# Patient Record
Sex: Female | Born: 1990 | Race: White | Hispanic: No | Marital: Married | Smoking: Former smoker
Health system: Southern US, Community
[De-identification: ages and names within clinical notes are randomized; demographics above are authoritative.]

## PROBLEM LIST (undated history)

## (undated) ENCOUNTER — Inpatient Hospital Stay: Payer: Self-pay | Admitting: Obstetrics and Gynecology

## (undated) DIAGNOSIS — F419 Anxiety disorder, unspecified: Secondary | ICD-10-CM

## (undated) DIAGNOSIS — F32A Depression, unspecified: Secondary | ICD-10-CM

## (undated) DIAGNOSIS — Z789 Other specified health status: Secondary | ICD-10-CM

## (undated) HISTORY — PX: NO PAST SURGERIES: SHX2092

---

## 2015-12-12 NOTE — L&D Delivery Note (Addendum)
Obstetrical Delivery Note   Date of Delivery:   12/16/2015 Primary OB:   [facility] Gestational Age/EDD: 3729w2d (Dated by 9 wk ultrasound) Antepartum complications: anemia  Delivered By:   Farrel ConnersGUTIERREZ, Laquon Emel, CNM  Delivery Type:   spontaneous vaginal delivery after vacuum assisted descent from +3 x 3 contractions with one pop off. Vacuum applied due to prolonged repetitive fetal heart rate decelerations Procedure Details:   After SNA was performed by anesthesia, patient labored down to +2 and because of variable decelerations, patient was encouraged to start pushing. She brought the baby down to +3 station with good maternal effort, pushing every other contraction to allow baby to recover from deceleration with pushing. Because it was taking baby longer to recover from deceleration, Kiwi vacuum was applied and over three contractions, baby started to crown at which time the vacuum popped off. Patient was then to deliver the fetal head then body spontaneously with good maternal effort. Baby was vigorous with good cry. Delayed cord clamping was performed and FOB cut cord. Baby placed skin to skin with mother Anesthesia:    Spinal narcotics administration Intrapartum complications: prolonged fetal heart rate decelerations/ PPROM GBS:    unknown Laceration:    2nd degree, small perineal repaired with 2-0 and 3-0 Chromic Episiotomy:    none Placenta:    Via active 3rd stage. To pathology: no Estimated Blood Loss:  400  Baby:    Liveborn female, Apgars 8/9, weight: 6-3 (2810) / Victoria/ Breast    Reginal Wojcicki, CNM

## 2015-12-16 ENCOUNTER — Encounter: Payer: Self-pay | Admitting: *Deleted

## 2015-12-16 ENCOUNTER — Inpatient Hospital Stay: Payer: Managed Care, Other (non HMO) | Admitting: Certified Registered"

## 2015-12-16 ENCOUNTER — Inpatient Hospital Stay
Admission: EM | Admit: 2015-12-16 | Discharge: 2015-12-18 | DRG: 775 | Disposition: A | Discharge status: Home / Self Care | Payer: Managed Care, Other (non HMO) | Attending: Obstetrics & Gynecology | Admitting: Obstetrics & Gynecology

## 2015-12-16 DIAGNOSIS — D62 Acute posthemorrhagic anemia: Secondary | ICD-10-CM | POA: Diagnosis not present

## 2015-12-16 DIAGNOSIS — O42913 Preterm premature rupture of membranes, unspecified as to length of time between rupture and onset of labor, third trimester: Principal | ICD-10-CM | POA: Diagnosis present

## 2015-12-16 DIAGNOSIS — O9081 Anemia of the puerperium: Secondary | ICD-10-CM | POA: Diagnosis not present

## 2015-12-16 DIAGNOSIS — Z3A36 36 weeks gestation of pregnancy: Secondary | ICD-10-CM | POA: Diagnosis not present

## 2015-12-16 HISTORY — DX: Other specified health status: Z78.9

## 2015-12-16 LAB — CBC
HEMATOCRIT: 35.8 % (ref 35.0–47.0)
Hemoglobin: 11.9 g/dL — ABNORMAL LOW (ref 12.0–16.0)
MCH: 30 pg (ref 26.0–34.0)
MCHC: 33.4 g/dL (ref 32.0–36.0)
MCV: 89.9 fL (ref 80.0–100.0)
Platelets: 212 10*3/uL (ref 150–440)
RBC: 3.98 MIL/uL (ref 3.80–5.20)
RDW: 13.5 % (ref 11.5–14.5)
WBC: 15.8 10*3/uL — ABNORMAL HIGH (ref 3.6–11.0)

## 2015-12-16 LAB — ABO/RH: ABO/RH(D): O POS

## 2015-12-16 LAB — TYPE AND SCREEN
ABO/RH(D): O POS
Antibody Screen: NEGATIVE

## 2015-12-16 MED ORDER — DOCUSATE SODIUM 100 MG PO CAPS
100.0000 mg | ORAL_CAPSULE | Freq: Every day | ORAL | Status: DC
  Administered 2015-12-16 – 2015-12-17 (×2): 100 mg via ORAL
  Filled 2015-12-16 (×2): qty 1

## 2015-12-16 MED ORDER — MISOPROSTOL 200 MCG PO TABS
ORAL_TABLET | ORAL | Status: AC
  Filled 2015-12-16: qty 4

## 2015-12-16 MED ORDER — NALOXONE HCL 2 MG/2ML IJ SOSY: 1.0000 ug/kg/h | PREFILLED_SYRINGE | INTRAVENOUS | Status: DC | PRN

## 2015-12-16 MED ORDER — OXYTOCIN 40 UNITS IN LACTATED RINGERS INFUSION - SIMPLE MED
INTRAVENOUS | Status: AC
  Filled 2015-12-16: qty 1000

## 2015-12-16 MED ORDER — FERROUS SULFATE 325 (65 FE) MG PO TABS
325.0000 mg | ORAL_TABLET | Freq: Every day | ORAL | Status: DC
  Administered 2015-12-17: 325 mg via ORAL
  Filled 2015-12-16: qty 1

## 2015-12-16 MED ORDER — SODIUM CHLORIDE 0.9 % IJ SOLN: 3.0000 mL | INTRAMUSCULAR | Status: DC | PRN

## 2015-12-16 MED ORDER — SIMETHICONE 80 MG PO CHEW: 80.0000 mg | CHEWABLE_TABLET | ORAL | Status: DC | PRN

## 2015-12-16 MED ORDER — SENNOSIDES-DOCUSATE SODIUM 8.6-50 MG PO TABS
2.0000 | ORAL_TABLET | ORAL | Status: DC
  Administered 2015-12-16 – 2015-12-18 (×2): 2 via ORAL
  Filled 2015-12-16 (×2): qty 2

## 2015-12-16 MED ORDER — ONDANSETRON HCL 4 MG/2ML IJ SOLN: 4.0000 mg | Freq: Three times a day (TID) | INTRAMUSCULAR | Status: DC | PRN

## 2015-12-16 MED ORDER — AMMONIA AROMATIC IN INHA: 0.3000 mL | Freq: Once | RESPIRATORY_TRACT | Status: DC | PRN

## 2015-12-16 MED ORDER — LANOLIN HYDROUS EX OINT: TOPICAL_OINTMENT | CUTANEOUS | Status: DC | PRN

## 2015-12-16 MED ORDER — INFLUENZA VAC SPLIT QUAD 0.5 ML IM SUSY: 0.5000 mL | PREFILLED_SYRINGE | INTRAMUSCULAR | Status: DC

## 2015-12-16 MED ORDER — FENTANYL CITRATE (PF) 100 MCG/2ML IJ SOLN
INTRAMUSCULAR | Status: AC
  Administered 2015-12-16: 25 ug via INTRATHECAL
  Filled 2015-12-16: qty 2

## 2015-12-16 MED ORDER — IBUPROFEN 600 MG PO TABS
600.0000 mg | ORAL_TABLET | Freq: Four times a day (QID) | ORAL | Status: DC | PRN
  Filled 2015-12-16 (×3): qty 1

## 2015-12-16 MED ORDER — MISOPROSTOL 200 MCG PO TABS
800.0000 ug | ORAL_TABLET | Freq: Once | ORAL | Status: DC | PRN
  Filled 2015-12-16: qty 4

## 2015-12-16 MED ORDER — BUTORPHANOL TARTRATE 1 MG/ML IJ SOLN
1.0000 mg | INTRAMUSCULAR | Status: DC | PRN
  Administered 2015-12-16: 1 mg via INTRAVENOUS
  Filled 2015-12-16: qty 1

## 2015-12-16 MED ORDER — OXYTOCIN 10 UNIT/ML IJ SOLN
INTRAMUSCULAR | Status: AC
  Filled 2015-12-16: qty 2

## 2015-12-16 MED ORDER — SCOPOLAMINE 1 MG/3DAYS TD PT72
1.0000 | MEDICATED_PATCH | Freq: Once | TRANSDERMAL | Status: DC
  Filled 2015-12-16: qty 1

## 2015-12-16 MED ORDER — OXYTOCIN BOLUS FROM INFUSION
500.0000 mL | INTRAVENOUS | Status: DC
  Administered 2015-12-16: 500 mL via INTRAVENOUS

## 2015-12-16 MED ORDER — BUPIVACAINE IN DEXTROSE 0.75-8.25 % IT SOLN
INTRATHECAL | Status: DC | PRN
  Administered 2015-12-16: .4 mL via INTRATHECAL

## 2015-12-16 MED ORDER — DIPHENHYDRAMINE HCL 25 MG PO CAPS: 25.0000 mg | ORAL_CAPSULE | ORAL | Status: DC | PRN

## 2015-12-16 MED ORDER — NALBUPHINE HCL 10 MG/ML IJ SOLN: 5.0000 mg | Freq: Once | INTRAMUSCULAR | Status: DC | PRN

## 2015-12-16 MED ORDER — WITCH HAZEL-GLYCERIN EX PADS: 1.0000 "application " | MEDICATED_PAD | CUTANEOUS | Status: DC | PRN

## 2015-12-16 MED ORDER — LACTATED RINGERS IV SOLN: 500.0000 mL | INTRAVENOUS | Status: DC | PRN

## 2015-12-16 MED ORDER — LACTATED RINGERS IV SOLN
INTRAVENOUS | Status: DC
  Administered 2015-12-16: 125 mL/h via INTRAVENOUS

## 2015-12-16 MED ORDER — MEPERIDINE HCL 25 MG/ML IJ SOLN: 6.2500 mg | INTRAMUSCULAR | Status: DC | PRN

## 2015-12-16 MED ORDER — ONDANSETRON HCL 4 MG/2ML IJ SOLN
4.0000 mg | INTRAMUSCULAR | Status: DC | PRN
Start: 2015-12-16 — End: 2015-12-18

## 2015-12-16 MED ORDER — TERBUTALINE SULFATE 1 MG/ML IJ SOLN
INTRAMUSCULAR | Status: AC
  Administered 2015-12-16: 0.25 mg
  Filled 2015-12-16: qty 1

## 2015-12-16 MED ORDER — SODIUM CHLORIDE 0.9 % IV SOLN
1.0000 g | INTRAVENOUS | Status: DC
  Filled 2015-12-16 (×3): qty 1000

## 2015-12-16 MED ORDER — LIDOCAINE HCL (PF) 1 % IJ SOLN
INTRAMUSCULAR | Status: AC
  Filled 2015-12-16: qty 30

## 2015-12-16 MED ORDER — IBUPROFEN 600 MG PO TABS
600.0000 mg | ORAL_TABLET | Freq: Four times a day (QID) | ORAL | Status: DC
  Administered 2015-12-16 – 2015-12-18 (×6): 600 mg via ORAL
  Filled 2015-12-16 (×3): qty 1

## 2015-12-16 MED ORDER — AMMONIA AROMATIC IN INHA
RESPIRATORY_TRACT | Status: AC
  Filled 2015-12-16: qty 10

## 2015-12-16 MED ORDER — NALBUPHINE HCL 10 MG/ML IJ SOLN: 5.0000 mg | INTRAMUSCULAR | Status: DC | PRN

## 2015-12-16 MED ORDER — LIDOCAINE HCL (PF) 1 % IJ SOLN
30.0000 mL | INTRAMUSCULAR | Status: DC | PRN
  Filled 2015-12-16: qty 30

## 2015-12-16 MED ORDER — OXYTOCIN 40 UNITS IN LACTATED RINGERS INFUSION - SIMPLE MED
2.5000 [IU]/h | INTRAVENOUS | Status: DC
  Administered 2015-12-16: 2.5 [IU]/h via INTRAVENOUS

## 2015-12-16 MED ORDER — ONDANSETRON HCL 4 MG/2ML IJ SOLN: 4.0000 mg | Freq: Four times a day (QID) | INTRAMUSCULAR | Status: DC | PRN

## 2015-12-16 MED ORDER — DIPHENHYDRAMINE HCL 50 MG/ML IJ SOLN: 12.5000 mg | INTRAMUSCULAR | Status: DC | PRN

## 2015-12-16 MED ORDER — PRENATAL MULTIVITAMIN CH
1.0000 | ORAL_TABLET | Freq: Every day | ORAL | Status: DC
  Administered 2015-12-17: 1 via ORAL
  Filled 2015-12-16: qty 1

## 2015-12-16 MED ORDER — TETANUS-DIPHTH-ACELL PERTUSSIS 5-2.5-18.5 LF-MCG/0.5 IM SUSP: 0.5000 mL | Freq: Once | INTRAMUSCULAR | Status: DC

## 2015-12-16 MED ORDER — BENZOCAINE-MENTHOL 20-0.5 % EX AERO
1.0000 "application " | INHALATION_SPRAY | CUTANEOUS | Status: DC | PRN
  Filled 2015-12-16: qty 56

## 2015-12-16 MED ORDER — NALOXONE HCL 0.4 MG/ML IJ SOLN: 0.4000 mg | INTRAMUSCULAR | Status: DC | PRN

## 2015-12-16 MED ORDER — ONDANSETRON HCL 4 MG PO TABS: 4.0000 mg | ORAL_TABLET | ORAL | Status: DC | PRN

## 2015-12-16 MED ORDER — SODIUM CHLORIDE 0.9 % IV SOLN
2.0000 g | Freq: Once | INTRAVENOUS | Status: AC
  Administered 2015-12-16: 2 g via INTRAVENOUS

## 2015-12-16 MED ORDER — DIBUCAINE 1 % RE OINT: 1.0000 "application " | TOPICAL_OINTMENT | RECTAL | Status: DC | PRN

## 2015-12-16 NOTE — Progress Notes (Signed)
FSE removed for vacum placement. Vacum applied 1052 am. Suction released between contractions. Pop off x1.

## 2015-12-16 NOTE — Anesthesia Procedure Notes (Signed)
Spinal Patient location during procedure: OB Start time: 12/16/2015 9:35 AM End time: 12/16/2015 9:42 AM Reason for block: procedure for pain Staffing Anesthesiologist: Marline Backbone F Resident/CRNA: Rolla Plate Performed by: resident/CRNA  Preanesthetic Checklist Completed: patient identified, site marked, surgical consent, pre-op evaluation, timeout performed, IV checked, risks and benefits discussed and monitors and equipment checked Spinal Block Patient position: sitting Prep: ChloraPrep and site prepped and draped Patient monitoring: heart rate, continuous pulse ox, blood pressure and cardiac monitor Approach: midline Location: L4-5 Injection technique: single-shot Needle Needle type: Whitacre and Introducer  Needle gauge: 25 G Needle length: 9 cm Additional Notes Negative paresthesia. Negative blood return. Positive free-flowing CSF. Expiration date of kit checked and confirmed. Patient tolerated procedure well, without complications.

## 2015-12-16 NOTE — H&P (Signed)
OB History & Physical   History of Present Illness:  Chief Complaint:  My BOW broke at 0630 this Am and that's when my contractions started. HPI:  Trinna BalloonKimberly J Basso is a 25 y.o. G1P0 female with EDC=01/11/2016 at 465w2d dated by a 9 wk ultrasound.  Her pregnancy has been mildly complicated by anemia, an elevated 1hr GTT with a normal 3hr GTT and a hx of depression. She quit smoking when she became pregnant..  She presents to L&D for evaluation of PPROM and labor.   [facility]       Maternal Medical History:   Past Medical History  Diagnosis Date  . Medical history non-contributory   Past hx of depression Mild anemia  Past Surgical History  Procedure Laterality Date  . No past surgeries      No Known Allergies  Prior to Admission medications   Medication Sig Start Date End Date Taking? Authorizing Provider  calcium carbonate (TUMS - DOSED IN MG ELEMENTAL CALCIUM) 500 MG chewable tablet Chew 1 tablet by mouth 3 (three) times daily as needed for indigestion or heartburn.   Yes Historical Provider, MD  Prenatal Vit-Fe Fumarate-FA (MULTIVITAMIN-PRENATAL) 27-0.8 MG TABS tablet Take 1 tablet by mouth daily at 12 noon.   Yes Historical Provider, MD          Social History: She  reports that she quit smoking about 7 months ago. Her smoking use included Cigarettes. She smoked 1.00 pack per day. She does not have any smokeless tobacco history on file. She reports that she does not drink alcohol or use illicit drugs.  Family History: family history is not on file.   Review of Systems: Negative x 10 systems reviewed except as noted in the HPI.      Physical Exam:  Vital Signs: Ht 5\' 1"  (1.549 m)  Wt 63.504 kg (140 lb)  BMI 26.47 kg/m2BP:  General: breathing with contractions  HEENT: normocephalic, atraumatic Heart: regular rate & rhythm.  No murmurs Lungs: clear to auscultation bilaterally Abdomen: soft, gravid, non-tender;  EFW:  6-14 Pelvic:   External: Normal external female genitalia  Cervix: Dilation: 6 / Effacement (%): 80 / Station: -1     ROM: positive nitrazine;  Extremities: non-tender, symmetric, traceedema bilaterally.  DTRs: +3  Neurologic: Alert & oriented x 3.    Pertinent Results:  Prenatal Labs: Blood type/Rh O positive  Antibody screen negative  Rubella Varicella Immune immune  RPR nonreactive  HBsAg negative  HIV negative  GC Negative  Chlamydia negative  Genetic screening Negative first trimester test  1 hour GTT 145  3 hour GTT 81/170/119/72  GBS unknown    Baseline FHR: 140 beats with accelerations to 150s to 160 and moderate variability Toco: contractions q 1-2 minutes    Bedside Ultrasound: ROA   Assessment:  Trinna BalloonKimberly J Agnes is a 25 y.o. G1P0 female at 3265w2d with PPROM in active labor  Plan:  1. Admit to Labor & Delivery  2. CBC, T&S, Clrs, IVF 3. GBS unknown-start GBS PPX.   4. Consents obtained. 5. Stadol for pain 6. Epidural when available   Charlies Rayburn  12/16/2015 8:35 AM

## 2015-12-16 NOTE — Discharge Summary (Addendum)
Physician Obstetric Discharge Summary  Patient ID: Samantha Levy MRN: [id number] DOB/AGE: 25/03/1991 24 y.o.   Date of Admission: 12/16/2015  Date of Discharge: 12/18/2015  Admitting Diagnosis: Preterm premature rupture of membranes/ Onset of labor at 36.2 weeks   Mode of Delivery: normal spontaneous vaginal delivery 12/15/2014 with use of vacuum assist for descent    Discharge Diagnosis: Preterm labor and delivery at 36.2 weeks, fetal heart rate decelerations, short cord   Intrapartum Procedures: GBS prophylaxis, placement of fetal scalp electrode and spianl narcotic administration, and repair of small second degree perineal laceration   Complications: Prolonged fetal heart rate decelerations and short cord.   Brief Hospital Course  Samantha Levy is a [id number] who had a SVD/vacuum assisted descent on 12/16/2015;  for further details of this delivery, please refer to the delivery note.  Patient had an uncomplicated postpartum course. Had some mild postpartum anemia and hemorrhoidal discomfort.  By time of discharge on PPD#1, her pain was controlled on oral pain medications; she had appropriate lochia and was ambulating, voiding without difficulty and tolerating regular diet.  She was deemed stable for discharge to home.     Labs: CBC Latest Ref Rng 12/17/2015 12/16/2015  WBC 3.6 - 11.0 K/uL 13.8(H) 15.8(H)  Hemoglobin 12.0 - 16.0 g/dL 6.2(X9.7(L) 11.9(L)  Hematocrit 35.0 - 47.0 % 28.4(L) 35.8  Platelets 150 - 440 K/uL 162 212   O POS  Physical exam:  Blood pressure 111/67, pulse 58, temperature 97.9 F (36.6 C), temperature source Oral, resp. rate 16, height 5\' 1"  (1.549 m), weight 63.504 kg (140 lb), SpO2 99 %, unknown if currently breastfeeding. General: alert and no distress Lochia: appropriate Abdomen: soft, NT Uterine Fundus: firm Extremities: No evidence of DVT seen on physical exam. No lower extremity edema.  Discharge Instructions: Per After Visit Summary. Activity:  Advance as tolerated. Pelvic rest for 6 weeks.  Also refer to Discharge Instructions Diet: Regular Medications:   Medication List    STOP taking these medications        calcium carbonate 500 MG chewable tablet  Commonly known as:  TUMS - dosed in mg elemental calcium      TAKE these medications        ferrous sulfate 325 (65 FE) MG tablet  Take 1 tablet (325 mg total) by mouth daily with breakfast.     hydrocortisone-pramoxine 2.5-1 % rectal cream  Commonly known as:  ANALPRAM HC  Place 1 application rectally 4 (four) times daily as needed for hemorrhoids or itching.     ibuprofen 600 MG tablet  Commonly known as:  ADVIL,MOTRIN  Take 1 tablet (600 mg total) by mouth every 6 (six) hours as needed for mild pain.     multivitamin-prenatal 27-0.8 MG Tabs tablet  Take 1 tablet by mouth daily at 12 noon.     Tdap 5-2.5-18.5 LF-MCG/0.5 injection  Commonly known as:  BOOSTRIX  Inject 0.5 mLs into the muscle once.       Outpatient follow up:  Follow-up Information    Follow up with Lakota Markgraf, CNM. Call in 3 days.   Specialty:  Certified Nurse Midwife   Why:  1 week follow up appt   Contact information:   [address] [place] [address] [phone]      Postpartum contraception: considering options appropriate for breastfeeding  Discharged Condition: good  Discharged to: home   Newborn Data: Disposition:home with mother  Apgars: APGAR (1 MIN): 8   APGAR (5 MINS): 9  APGAR (10 MINS):    Baby Feeding: Breast/ Victoria/ 2810 (6-3)  Brittinie Wherley, CNM 12/18/2015 9:10 AM

## 2015-12-16 NOTE — Anesthesia Preprocedure Evaluation (Signed)
Anesthesia Evaluation  Patient identified by MRN, date of birth, ID band Patient awake    Reviewed: Allergy & Precautions, H&P , NPO status , Patient's Chart, lab work & pertinent test results  History of Anesthesia Complications Negative for: history of anesthetic complications  Airway Mallampati: II  TM Distance: >3 FB Neck ROM: full    Dental no notable dental hx.    Pulmonary former smoker,    Pulmonary exam normal        Cardiovascular negative cardio ROS Normal cardiovascular exam     Neuro/Psych negative neurological ROS  negative psych ROS   GI/Hepatic Neg liver ROS, GERD  ,  Endo/Other  negative endocrine ROS  Renal/GU negative Renal ROS  negative genitourinary   Musculoskeletal   Abdominal   Peds  Hematology negative hematology ROS (+)   Anesthesia Other Findings   Reproductive/Obstetrics (+) Pregnancy                             Anesthesia Physical Anesthesia Plan  ASA: II  Anesthesia Plan: Spinal   Post-op Pain Management:    Induction:   Airway Management Planned:   Additional Equipment:   Intra-op Plan:   Post-operative Plan:   Informed Consent: I have reviewed the patients History and Physical, chart, labs and discussed the procedure including the risks, benefits and alternatives for the proposed anesthesia with the patient or authorized representative who has indicated his/her understanding and acceptance.     Plan Discussed with: Anesthesiologist and CRNA  Anesthesia Plan Comments:         Anesthesia Quick Evaluation

## 2015-12-17 LAB — CBC
HEMATOCRIT: 28.4 % — AB (ref 35.0–47.0)
Hemoglobin: 9.7 g/dL — ABNORMAL LOW (ref 12.0–16.0)
MCH: 31.2 pg (ref 26.0–34.0)
MCHC: 34 g/dL (ref 32.0–36.0)
MCV: 91.7 fL (ref 80.0–100.0)
Platelets: 162 10*3/uL (ref 150–440)
RBC: 3.1 MIL/uL — AB (ref 3.80–5.20)
RDW: 13.6 % (ref 11.5–14.5)
WBC: 13.8 10*3/uL — AB (ref 3.6–11.0)

## 2015-12-17 LAB — RPR: RPR Ser Ql: NONREACTIVE

## 2015-12-17 MED ORDER — HYDROCODONE-ACETAMINOPHEN 5-325 MG PO TABS
1.0000 | ORAL_TABLET | ORAL | Status: DC | PRN
  Administered 2015-12-17: 1 via ORAL
  Filled 2015-12-17: qty 2

## 2015-12-17 MED ORDER — TETANUS-DIPHTH-ACELL PERTUSSIS 5-2.5-18.5 LF-MCG/0.5 IM SUSP: 0.5000 mL | Freq: Once | INTRAMUSCULAR | Status: DC

## 2015-12-17 NOTE — Anesthesia Post-op Follow-up Note (Signed)
  Anesthesia Pain Follow-up Note  Patient: Samantha Levy  Day #: 1  Date of Follow-up: 12/17/2015 Time: 7:50 AM  Last Vitals:  Filed Vitals:   12/17/15 0412 12/17/15 0712  BP: 104/59 97/58  Pulse: 68 63  Temp: 36.8 C 36.8 C  Resp: 20 18    Level of Consciousness: alert  Pain: 4 /10   Side Effects:None  Catheter Site Exam:clean, dry, no drainage  Plan: D/C from anesthesia care  Starling Mannsurtis,  Abbee Cremeens A

## 2015-12-17 NOTE — Anesthesia Postprocedure Evaluation (Signed)
Anesthesia Post Note  Patient: Samantha Levy  Procedure(s) Performed: * No procedures listed *  Patient location during evaluation: Mother Baby Anesthesia Type: Spinal Level of consciousness: awake and alert Pain management: pain level controlled Vital Signs Assessment: post-procedure vital signs reviewed and stable Respiratory status: spontaneous breathing and respiratory function stable Cardiovascular status: blood pressure returned to baseline and stable Postop Assessment: spinal receding and patient able to bend at knees Anesthetic complications: no    Last Vitals:  Filed Vitals:   12/17/15 0412 12/17/15 0712  BP: 104/59 97/58  Pulse: 68 63  Temp: 36.8 C 36.8 C  Resp: 20 18    Last Pain:  Filed Vitals:   12/17/15 [id number]  PainSc: 0-No pain                 Starling Mannsurtis,  Brylin Stopper A

## 2015-12-17 NOTE — Progress Notes (Addendum)
  Subjective:  25 year old G1 P1 PPDay 1 without complaints, pain well controlled, minimal lochia  Objective:   Blood pressure 97/58, pulse 63, temperature 98.2 F (36.8 C), temperature source Oral, resp. rate 18, height 5\' 1"  (1.549 m), weight 63.504 kg (140 lb), breastfeeding  General: NAD Pulmonary: no increased work of breathing Abdomen: non-distended, non-tender, fundus firm at level of umbilicus Extremities: no edema, no erythema, no tenderness  Results for orders placed or performed during the hospital encounter of 12/16/15 (from the past 72 hour(s))  CBC     Status: Abnormal   Collection Time: 12/16/15  8:31 AM  Result Value Ref Range   WBC 15.8 (H) 3.6 - 11.0 K/uL   RBC 3.98 3.80 - 5.20 MIL/uL   Hemoglobin 11.9 (L) 12.0 - 16.0 g/dL   HCT 16.135.8 09.635.0 - 04.547.0 %   MCV 89.9 80.0 - 100.0 fL   MCH 30.0 26.0 - 34.0 pg   MCHC 33.4 32.0 - 36.0 g/dL   RDW 40.913.5 81.111.5 - 91.414.5 %   Platelets 212 150 - 440 K/uL  Type and screen     Status: None   Collection Time: 12/16/15  8:31 AM  Result Value Ref Range   ABO/RH(D) O POS    Antibody Screen NEG    Sample Expiration 12/19/2015   ABO/Rh     Status: None   Collection Time: 12/16/15  8:32 AM  Result Value Ref Range   ABO/RH(D) O POS   RPR     Status: None   Collection Time: 12/16/15  9:01 AM  Result Value Ref Range   RPR Ser Ql Non Reactive Non Reactive    Comment: (NOTE) Performed At: [facility] [address] [place], [place] [id number] Mila HomerHancock William F MD [id number]:[phone]   CBC     Status: Abnormal   Collection Time: 12/17/15  6:16 AM  Result Value Ref Range   WBC 13.8 (H) 3.6 - 11.0 K/uL   RBC 3.10 (L) 3.80 - 5.20 MIL/uL   Hemoglobin 9.7 (L) 12.0 - 16.0 g/dL   HCT 52.828.4 (L) 41.335.0 - 24.447.0 %   MCV 91.7 80.0 - 100.0 fL   MCH 31.2 26.0 - 34.0 pg   MCHC 34.0 32.0 - 36.0 g/dL   RDW 01.013.6 27.211.5 - 53.614.5 %   Platelets 162 150 - 440 K/uL    Assessment:   25 y.o. G1P0101 postpartum day # 1  Plan:    1) Acute blood loss  anemia - hemodynamically stable and asymptomatic - po ferrous sulfate  2) O+ /R Immune / Varicella Immune  3) Needs TDAP and Flu vaccine before discharge  4) Follow up 1 week for PPD check  4) Breast/Contraception unsure- reviewed hormonal choices for breastfeeding  5) Disposition- home ppday 2

## 2015-12-18 MED ORDER — IBUPROFEN 600 MG PO TABS: 600.0000 mg | ORAL_TABLET | Freq: Four times a day (QID) | ORAL | Status: DC | PRN

## 2015-12-18 MED ORDER — FERROUS SULFATE 325 (65 FE) MG PO TABS: 325.0000 mg | ORAL_TABLET | Freq: Every day | ORAL | Status: DC

## 2015-12-18 MED ORDER — HYDROCORTISONE ACE-PRAMOXINE 2.5-1 % RE CREA: 1.0000 "application " | TOPICAL_CREAM | Freq: Four times a day (QID) | RECTAL | Status: DC | PRN

## 2015-12-18 NOTE — Discharge Instructions (Signed)
Breast Pumping Tips If you are breastfeeding, there may be times when you cannot feed your baby directly. Returning to work or going on a trip are common examples. Pumping allows you to store breast milk and feed it to your baby later.  You may not get much milk when you first start to pump. Your breasts should start to make more after a few days. If you pump at the times you usually feed your baby, you may be able to keep making enough milk to feed your baby without also using formula. The more often you pump, the more milk you will produce. WHEN SHOULD I PUMP?  1. You can begin to pump soon after delivery. However, some experts recommend waiting about 4 weeks before giving your infant a bottle to make sure breastfeeding is going well. 2. If you plan to return to work, begin pumping a few weeks before. This will help you develop techniques that work best for you. It also lets you build up a supply of breast milk.  3. When you are with your infant, feed on demand and pump after each feeding.  4. When you are away from your infant for several hours, pump for about 15 minutes every 2-3 hours. Pump both breasts at the same time if you can.  5. If your infant has a formula feeding, make sure to pump around the same time.  6. If you drink any alcohol, wait 2 hours before pumping.  HOW DO I PREPARE TO PUMP? Your let-down reflexis the natural reaction to stimulation that makes your breast milk flow. It is easier to stimulate this reflex when you are relaxed. Find relaxation techniques that work for you. If you have difficulty with your let-down reflex, try these methods:   Smell one of your infant's blankets or an item of clothing.   Look at a picture or video of your infant.   Sit in a quiet, private space.   Massage the breast you plan to pump.   Place soothing warmth on the breast.   Play relaxing music.  WHAT ARE SOME GENERAL BREAST PUMPING TIPS?  Wash your hands before you  pump. You do not need to wash your nipples or breasts.  There are three ways to pump.  You can use your hand to massage and compress your breast.  You can use a handheld manual pump.  You can use an electric pump.   Make sure the suction cup (flange) on the breast pump is the right size. Place the flange directly over the nipple. If it is the wrong size or placed the wrong way, it may be painful and cause nipple damage.   If pumping is uncomfortable, apply a small amount of purified or modified lanolin to your nipple and areola.  If you are using an electric pump, adjust the speed and suction power to be more comfortable.  If pumping is painful or if you are not getting very much milk, you may need a different type of pump. A lactation consultant can help you determine what type of pump to use.   Keep a full water bottle near you at all times. Drinking lots of fluid helps you make more milk.  You can store your milk to use later. Pumped breast milk can be stored in a sealable, sterile container or plastic bag. Label all stored breast milk with the date you pumped it.  Milk can stay out at room temperature for up to 8 hours.  You can store your milk in the refrigerator for up to 8 days.  You can store your milk in the freezer for 3 months. Thaw frozen milk using warm water. Do not put it in the microwave.  Do not smoke. Smoking can lower your milk supply and harm your infant. If you need help quitting, ask your health care provider to recommend a program.  WHEN SHOULD I CALL MY HEALTH CARE PROVIDER OR A LACTATION CONSULTANT?  You are having trouble pumping.  You are concerned that you are not making enough milk.  You have nipple pain, soreness, or redness.  You want to use birth control. Birth control pills may lower your milk supply. Talk to your health care provider about your options.   This information is not intended to replace advice given to you by your health care  provider. Make sure you discuss any questions you have with your health care provider.   Document Released: 05/17/2010 Document Revised: 12/02/2013 Document Reviewed: 09/19/2013 Elsevier Interactive Patient Education 2016 Elsevier Inc.   Vaginal Delivery, Care After Refer to this sheet in the next few weeks. These discharge instructions provide you with information on caring for yourself after delivery. Your caregiver may also give you specific instructions. Your treatment has been planned according to the most current medical practices available, but problems sometimes occur. Call your caregiver if you have any problems or questions after you go home. HOME CARE INSTRUCTIONS 7. Take over-the-counter or prescription medicines only as directed by your caregiver or pharmacist. 8. Do not drink alcohol, especially if you are breastfeeding or taking medicine to relieve pain. 9. Do not smoke tobacco. 10. Continue to use good perineal care. Good perineal care includes: 1. Wiping your perineum from back to front 2. Keeping your perineum clean. 3. You can do sitz baths twice a day, to help keep this area clean 11. Do not use tampons, douche or have sex until your caregiver says it is okay. 12. Shower only and avoid sitting in submerged water, aside from sitz baths 13. Wear a well-fitting bra that provides breast support. 14. Eat healthy foods. 15. Drink enough fluids to keep your urine clear or pale yellow. 16. Eat high-fiber foods such as whole grain cereals and breads, brown rice, beans, and fresh fruits and vegetables every day. These foods may help prevent or relieve constipation. 17. Avoid constipation with high fiber foods or medications, such as miralax or metamucil 18. Follow your caregiver's recommendations regarding resumption of activities such as climbing stairs, driving, lifting, exercising, or traveling. 19. Talk to your caregiver about resuming sexual activities. Resumption of sexual  activities is dependent upon your risk of infection, your rate of healing, and your comfort and desire to resume sexual activity. 20. Try to have someone help you with your household activities and your newborn for at least a few days after you leave the hospital. 21. Rest as much as possible. Try to rest or take a nap when your newborn is sleeping. 22. Increase your activities gradually. 23. Keep all of your scheduled postpartum appointments. It is very important to keep your scheduled follow-up appointments. At these appointments, your caregiver will be checking to make sure that you are healing physically and emotionally. SEEK MEDICAL CARE IF:   You are passing large clots from your vagina. Save any clots to show your caregiver.  You have a foul smelling discharge from your vagina.  You have trouble urinating.  You are urinating frequently.  You have pain  when you urinate.  You have a change in your bowel movements.  You have increasing redness, pain, or swelling near your vaginal incision (episiotomy) or vaginal tear.  You have pus draining from your episiotomy or vaginal tear.  Your episiotomy or vaginal tear is separating.  You have painful, hard, or reddened breasts.  You have a severe headache.  You have blurred vision or see spots.  You feel sad or depressed.  You have thoughts of hurting yourself or your newborn.  You have questions about your care, the care of your newborn, or medicines.  You are dizzy or light-headed.  You have a rash.  You have nausea or vomiting.  You were breastfeeding and have not had a menstrual period within 12 weeks after you stopped breastfeeding.  You are not breastfeeding and have not had a menstrual period by the 12th week after delivery.  You have a fever. SEEK IMMEDIATE MEDICAL CARE IF:   You have persistent pain.  You have chest pain.  You have shortness of breath.  You faint.  You have leg pain.  You have stomach  pain.  Your vaginal bleeding saturates two or more sanitary pads in 1 hour. MAKE SURE YOU:   Understand these instructions.  Will watch your condition.  Will get help right away if you are not doing well or get worse. Document Released: 11/24/2000 Document Revised: 04/13/2014 Document Reviewed: 07/24/2012 [facility] Patient Information 2015 [place], [place]. This information is not intended to replace advice given to you by your health care provider. Make sure you discuss any questions you have with your health care provider.  Sitz Bath A sitz bath is a warm water bath taken in the sitting position. The water covers only the hips and butt (buttocks). We recommend using one that fits in the toilet, to help with ease of use and cleanliness. It may be used for either healing or cleaning purposes. Sitz baths are also used to relieve pain, itching, or muscle tightening (spasms). The water may contain medicine. Moist heat will help you heal and relax.  HOME CARE  Take 3 to 4 sitz baths a day. 24. Fill the bathtub half-full with warm water. 25. Sit in the water and open the drain a little. 26. Turn on the warm water to keep the tub half-full. Keep the water running constantly. 27. Soak in the water for 15 to 20 minutes. 28. After the sitz bath, pat the affected area dry. GET HELP RIGHT AWAY IF: You get worse instead of better. Stop the sitz baths if you get worse. MAKE SURE YOU:  Understand these instructions.  Will watch your condition.  Will get help right away if you are not doing well or get worse. Document Released: 01/04/2005 Document Revised: 08/21/2012 Document Reviewed: 03/27/2011 [facility] Patient Information 2015 [place], [place]. This information is not intended to replace advice given to you by your health care provider. Make sure you discuss any questions you have with your health care provider.

## 2015-12-18 NOTE — Progress Notes (Signed)
Discharged to home with newborn.

## 2015-12-18 NOTE — Progress Notes (Signed)
Pt request to obtain TDap vaccine at postpartum check.  Verbalizes u/o of discharge instructions and care of her hemorrhoid.

## 2017-08-20 ENCOUNTER — Ambulatory Visit (INDEPENDENT_AMBULATORY_CARE_PROVIDER_SITE_OTHER): Payer: Managed Care, Other (non HMO) | Admitting: Maternal Newborn

## 2017-08-20 ENCOUNTER — Encounter: Payer: Self-pay | Admitting: Maternal Newborn

## 2017-08-20 VITALS — BP 100/50 | Wt 111.0 lb

## 2017-08-20 DIAGNOSIS — Z3491 Encounter for supervision of normal pregnancy, unspecified, first trimester: Secondary | ICD-10-CM

## 2017-08-20 DIAGNOSIS — Z720 Tobacco use: Secondary | ICD-10-CM

## 2017-08-20 NOTE — Progress Notes (Addendum)
08/20/2017   Chief Complaint: Positive home pregnancy test, desires prenatal care.  Transfer of Care Patient: no  History of Present Illness: Ms. Samantha Levy is a 26 y.o. G2P0101Patient's last menstrual period was 06/25/2017. with an Estimated Date of Delivery: 04/01/2018, with the above CC.   Her periods were: regular periods every 28 days She was using no method when she conceived.  She has Positive signs or symptoms of nausea/vomiting of pregnancy, breast tenderness, fatigue. She has Negative signs or symptoms of miscarriage or preterm labor She identifies Negative Zika risk factors for her and her partner On any different medications around the time she conceived/early pregnancy: No  History of varicella: No   ROS: A 12-point review of systems was performed and negative, except as stated in the above HPI.  OBGYN History: As per HPI. OB History  Gravida Para Term Preterm AB Living  2 1   1   1   SAB TAB Ectopic Multiple Live Births        0 1    # Outcome Date GA Lbr Len/2nd Weight Sex Delivery Anes PTL Lv  2 Current           1 Preterm 12/16/15 [id number] 03:42 / 01:27 6 lb 3.1 oz (2.81 kg) F Vag-Spont Spinal  LIV     Birth Comments: vernix covers entire body      Any issues with any prior pregnancies: yes, PPROM and preterm birth at 36 weeks, 2 days. Any prior children are healthy, doing well, without any problems or issues: yes History of pap smears: Yes, postpartum with G1. Abnormal: no  History of STIs: No   Past Medical History: Past Medical History:  Diagnosis Date  . Medical history non-contributory     Past Surgical History: Past Surgical History:  Procedure Laterality Date  . NO PAST SURGERIES      Family History:  History reviewed. No pertinent family history. She denies any female cancers, bleeding or blood clotting disorders.  She denies any history of intellectual disability, birth defects or genetic disorders in her or the FOB's history  Social History:   Social History   Social History  . Marital status: Married    Spouse name: N/A  . Number of children: 1  . Years of education: 2813   Occupational History  . retail/grocery     [employer]   Social History Main Topics  . Smoking status: Current Every Day Smoker    Packs/day: 0.50    Types: Cigarettes    Last attempt to quit: 05/16/2015  . Smokeless tobacco: Never Used     Comment: started back  . Alcohol use No  . Drug use: No  . Sexual activity: Yes    Birth control/ protection: None   Other Topics Concern  . Not on file   Social History Narrative  . No narrative on file   Any cats in the household: no Denies domestic violence.  Allergy: No Known Allergies  Current Outpatient Medications:  Current Outpatient Prescriptions:  .  Prenatal Vit-Fe Fumarate-FA (MULTIVITAMIN-PRENATAL) 27-0.8 MG TABS tablet, Take 1 tablet by mouth daily at 12 noon., Disp: , Rfl:    Physical Exam:   BP (!) 100/50   Wt 111 lb (50.3 kg)   LMP 06/25/2017   BMI 20.97 kg/m  Body mass index is 20.97 kg/m. Constitutional: Well nourished, well developed female in no acute distress.  Neck:  Supple, normal appearance, and no thyromegaly  Cardiovascular: S1, S2 normal, no murmur, rub or  gallop, regular rate and rhythm Respiratory:  Clear to auscultation bilateral. Normal respiratory effort Abdomen: positive bowel sounds and no masses, hernias; diffusely non tender to palpation, non distended Breasts: breasts appear normal, no suspicious masses, no skin or nipple changes or axillary nodes. Neuro/Psych:  Normal mood and affect.  Skin:  Warm and dry.  Lymphatic:  No inguinal lymphadenopathy.   Pelvic exam: is not limited by body habitus External genitalia, Bartholin's glands, Urethra, Skene's glands: within normal limits Vagina: within normal limits and with no blood in the vault  Cervix: normal appearing cervix without discharge or lesions, closed/long/high Uterus:  Enlarged c/w pregnancy, normal  contour Adnexa:  normal adnexa  Assessment: Samantha Levy is a 26 y.o. G2P0101 Unknown based on Patient's last menstrual period was 06/25/2017. with an Estimated Date of Delivery: None noted., presenting for prenatal care.  Plan:  1) Avoid alcoholic beverages. 2) Patient encouraged not to smoke. Is currently smoking anywhere from 2-3 cigarettes to 0.5 ppd. Counseled on using nicotine patch for cessation. 3) Discontinue the use of all non-medicinal drugs and chemicals.  4) Take prenatal vitamins daily.  5) Seatbelt use advised 6) Nutrition, food safety (fish, cheese advisories, and high nitrite foods) and exercise discussed. 7) Hospital and practice style delivering at [facility] discussed  8) Patient is asked about travel to areas at risk for the Zika virus, and counseled to avoid travel and exposure to mosquitoes or sexual partners who may have themselves been exposed to the virus.  9) Childbirth classes at [facility] advised 10) Genetic Screening, such as with 1st Trimester Screening, cell free fetal DNA, AFP testing, and Ultrasound, as well as with amniocentesis and CVS as appropriate, is discussed with patient. She plans to have genetic testing this pregnancy.  Problem list reviewed and updated.  Follow up ROB with [place] in 1 week for dates/viability.  Desires first trimester screen.  Marcelyn Bruins, CNM [facility], [facility] 08/20/2017  12:21 PM

## 2017-08-20 NOTE — Addendum Note (Signed)
Addended by: Marcelyn BruinsSCHMID, Colvin Blatt on: 08/20/2017 01:21 PM   Modules accepted: Orders

## 2017-08-21 LAB — RPR+RH+ABO+RUB AB+AB SCR+CB...
ANTIBODY SCREEN: NEGATIVE
HEMATOCRIT: 38.4 % (ref 34.0–46.6)
HEP B S AG: NEGATIVE
HIV Screen 4th Generation wRfx: NONREACTIVE
Hemoglobin: 13 g/dL (ref 11.1–15.9)
MCH: 31 pg (ref 26.6–33.0)
MCHC: 33.9 g/dL (ref 31.5–35.7)
MCV: 92 fL (ref 79–97)
Platelets: 294 10*3/uL (ref 150–379)
RBC: 4.19 x10E6/uL (ref 3.77–5.28)
RDW: 15 % (ref 12.3–15.4)
RH TYPE: POSITIVE
RPR Ser Ql: NONREACTIVE
Rubella Antibodies, IGG: 1.19 index (ref >0.99)
Varicella zoster IgG: 202 index (ref >165)
WBC: 11.1 10*3/uL — AB (ref 3.4–10.8)

## 2017-08-22 LAB — URINE CULTURE: Organism ID, Bacteria: NO GROWTH

## 2017-08-23 ENCOUNTER — Other Ambulatory Visit: Payer: Self-pay | Admitting: Maternal Newborn

## 2017-08-23 DIAGNOSIS — Z3491 Encounter for supervision of normal pregnancy, unspecified, first trimester: Secondary | ICD-10-CM

## 2017-08-23 LAB — PAPIG, CTNGTV, HPV, RFX 16/18
Chlamydia, Nuc. Acid Amp: NEGATIVE
Gonococcus, Nuc. Acid Amp: NEGATIVE
HPV, high-risk: NEGATIVE
PAP Smear Comment: 0
Trich vag by NAA: NEGATIVE

## 2017-08-27 ENCOUNTER — Ambulatory Visit (INDEPENDENT_AMBULATORY_CARE_PROVIDER_SITE_OTHER): Payer: Managed Care, Other (non HMO) | Admitting: Obstetrics and Gynecology

## 2017-08-27 ENCOUNTER — Ambulatory Visit (INDEPENDENT_AMBULATORY_CARE_PROVIDER_SITE_OTHER): Payer: Managed Care, Other (non HMO)

## 2017-08-27 ENCOUNTER — Other Ambulatory Visit: Payer: Self-pay | Admitting: Maternal Newborn

## 2017-08-27 VITALS — BP 108/64 | Wt 110.0 lb

## 2017-08-27 DIAGNOSIS — Z362 Encounter for other antenatal screening follow-up: Secondary | ICD-10-CM

## 2017-08-27 DIAGNOSIS — Z3491 Encounter for supervision of normal pregnancy, unspecified, first trimester: Secondary | ICD-10-CM

## 2017-08-27 DIAGNOSIS — Z1379 Encounter for other screening for genetic and chromosomal anomalies: Secondary | ICD-10-CM

## 2017-08-27 DIAGNOSIS — Z3A09 9 weeks gestation of pregnancy: Secondary | ICD-10-CM

## 2017-08-27 NOTE — Progress Notes (Signed)
Dating scan today. No vb. No lof.  °

## 2017-08-28 NOTE — Progress Notes (Signed)
S=D on ultrasound today 1 st trimester screen next visit

## 2017-09-03 ENCOUNTER — Encounter: Payer: Self-pay | Admitting: Emergency Medicine

## 2017-09-03 ENCOUNTER — Emergency Department
Admission: EM | Admit: 2017-09-03 | Discharge: 2017-09-03 | Disposition: A | Discharge status: Home / Self Care | Payer: Managed Care, Other (non HMO) | Attending: Student in an Organized Health Care Education/Training Program | Admitting: Student in an Organized Health Care Education/Training Program

## 2017-09-03 DIAGNOSIS — F17210 Nicotine dependence, cigarettes, uncomplicated: Secondary | ICD-10-CM | POA: Diagnosis not present

## 2017-09-03 DIAGNOSIS — Y939 Activity, unspecified: Secondary | ICD-10-CM | POA: Insufficient documentation

## 2017-09-03 DIAGNOSIS — M25512 Pain in left shoulder: Secondary | ICD-10-CM | POA: Insufficient documentation

## 2017-09-03 DIAGNOSIS — Z3A10 10 weeks gestation of pregnancy: Secondary | ICD-10-CM | POA: Diagnosis not present

## 2017-09-03 DIAGNOSIS — Y929 Unspecified place or not applicable: Secondary | ICD-10-CM | POA: Insufficient documentation

## 2017-09-03 DIAGNOSIS — Y999 Unspecified external cause status: Secondary | ICD-10-CM | POA: Insufficient documentation
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
## 2017-09-17 ENCOUNTER — Ambulatory Visit (INDEPENDENT_AMBULATORY_CARE_PROVIDER_SITE_OTHER): Payer: Managed Care, Other (non HMO)

## 2017-09-17 ENCOUNTER — Ambulatory Visit (INDEPENDENT_AMBULATORY_CARE_PROVIDER_SITE_OTHER): Payer: Managed Care, Other (non HMO) | Admitting: Obstetrics & Gynecology

## 2017-09-17 VITALS — BP 110/60 | Wt 112.0 lb

## 2017-09-17 DIAGNOSIS — Z23 Encounter for immunization: Secondary | ICD-10-CM | POA: Diagnosis not present

## 2017-09-17 DIAGNOSIS — Z3491 Encounter for supervision of normal pregnancy, unspecified, first trimester: Secondary | ICD-10-CM

## 2017-09-17 DIAGNOSIS — Z3A12 12 weeks gestation of pregnancy: Secondary | ICD-10-CM

## 2017-09-17 DIAGNOSIS — Z1379 Encounter for other screening for genetic and chromosomal anomalies: Secondary | ICD-10-CM

## 2017-09-17 NOTE — Patient Instructions (Signed)

## 2017-09-17 NOTE — Progress Notes (Signed)
Review of ULTRASOUND.  First Screen labs too.    I have personally reviewed images and report of recent ultrasound done at [facility].    Plan of management to be discussed with patient. Also, Flu shot today

## 2017-09-19 LAB — FIRST TRIMESTER SCREEN W/NT
CRL: 59.5 mm
DIA MOM: 0.92
DIA VALUE: 277.8 pg/mL
Gest Age-Collect: 12.4 weeks
Maternal Age At EDD: 26.6 yr
Nuchal Translucency MoM: 0.79
Nuchal Translucency: 1.2 mm
Number of Fetuses: 1
PAPP-A MOM: 0.43
PAPP-A VALUE: 540 ng/mL
Test Results:: NEGATIVE
WEIGHT: 112 [lb_av]
hCG MoM: 0.43
hCG Value: 46.7 IU/mL

## 2017-09-19 NOTE — Progress Notes (Signed)
LM.  Results negative form first trimester screen

## 2017-10-15 ENCOUNTER — Ambulatory Visit (INDEPENDENT_AMBULATORY_CARE_PROVIDER_SITE_OTHER): Payer: Managed Care, Other (non HMO) | Admitting: Obstetrics and Gynecology

## 2017-10-15 VITALS — BP 98/54 | Wt 119.0 lb

## 2017-10-15 DIAGNOSIS — Z3491 Encounter for supervision of normal pregnancy, unspecified, first trimester: Secondary | ICD-10-CM

## 2017-10-15 DIAGNOSIS — Z3A16 16 weeks gestation of pregnancy: Secondary | ICD-10-CM

## 2017-10-15 DIAGNOSIS — Z363 Encounter for antenatal screening for malformations: Secondary | ICD-10-CM

## 2017-10-15 NOTE — Progress Notes (Signed)
No concerns, some migraines discussed magnesium supplementation, anatomy scan in 4 weeks

## 2017-10-15 NOTE — Progress Notes (Signed)
ROB

## 2017-11-12 ENCOUNTER — Other Ambulatory Visit: Payer: Managed Care, Other (non HMO)

## 2017-11-12 ENCOUNTER — Encounter: Payer: Managed Care, Other (non HMO) | Admitting: Obstetrics & Gynecology

## 2017-11-19 ENCOUNTER — Encounter: Payer: Managed Care, Other (non HMO) | Admitting: Obstetrics and Gynecology

## 2017-11-19 ENCOUNTER — Other Ambulatory Visit: Payer: Managed Care, Other (non HMO)

## 2017-11-27 ENCOUNTER — Encounter: Payer: Self-pay | Admitting: Maternal Newborn

## 2017-11-27 ENCOUNTER — Ambulatory Visit (INDEPENDENT_AMBULATORY_CARE_PROVIDER_SITE_OTHER): Payer: Managed Care, Other (non HMO)

## 2017-11-27 ENCOUNTER — Ambulatory Visit (INDEPENDENT_AMBULATORY_CARE_PROVIDER_SITE_OTHER): Payer: Managed Care, Other (non HMO) | Admitting: Maternal Newborn

## 2017-11-27 VITALS — BP 100/60 | Wt 128.0 lb

## 2017-11-27 DIAGNOSIS — Z3492 Encounter for supervision of normal pregnancy, unspecified, second trimester: Secondary | ICD-10-CM

## 2017-11-27 DIAGNOSIS — Z363 Encounter for antenatal screening for malformations: Secondary | ICD-10-CM

## 2017-11-27 DIAGNOSIS — Z3491 Encounter for supervision of normal pregnancy, unspecified, first trimester: Secondary | ICD-10-CM

## 2017-11-27 NOTE — Progress Notes (Signed)
    Routine Prenatal Care Visit  Subjective  Samantha Levy is a 26 y.o. G2P0101 at 3855w1d being seen today for ongoing prenatal care.  She is currently monitored for the following issues for this low-risk pregnancy and has Tobacco abuse and Supervision of low-risk pregnancy, first trimester on their problem list.  ----------------------------------------------------------------------------------- Patient reports no complaints.   Contractions: Not present. Vag. Bleeding: None.  Movement: Present. Denies leaking of fluid.  ----------------------------------------------------------------------------------- The following portions of the patient's history were reviewed and updated as appropriate: allergies, current medications, past family history, past medical history, past social history, past surgical history and problem list. Problem list updated.   Objective  Last menstrual period 06/25/2017, unknown if currently breastfeeding. Pregravid weight 110 lb (49.9 kg) Total Weight Gain 18 lb (8.165 kg) Urinalysis: Urine Protein: Negative Urine Glucose: Negative  Fetal Status: Fetal Heart Rate (bpm): 153   Movement: Present     General:  Alert, oriented and cooperative. Patient is in no acute distress.  Skin: Skin is warm and dry. No rash noted.   Cardiovascular: Normal heart rate noted  Respiratory: Normal respiratory effort, no problems with respiration noted  Abdomen: Soft, gravid, appropriate for gestational age. Pain/Pressure: Absent     Pelvic:  Cervical exam deferred        Extremities: Normal range of motion.     Mental Status: Normal mood and affect. Normal behavior. Normal judgment and thought content.     Assessment   26 y.o. G2P0101 at 4455w1d, EDD 04/01/2018 by Last Menstrual Period presenting for routine prenatal visit.  Plan   second Problems (from 08/20/17 to present)    Problem Noted Resolved   Tobacco abuse 08/20/2017 by Oswaldo ConroySchmid, Brandie Lopes Y, CNM No   Supervision of  low-risk pregnancy, first trimester 08/20/2017 by Oswaldo ConroySchmid, Meeka Cartelli Y, CNM No   Overview Addendum 11/27/2017  9:12 AM by Oswaldo ConroySchmid, Sumi Lye Y, CNM    Clinic [facility] Prenatal Labs  Dating LMP=5260w1d US Blood type: O/Positive/-- (09/10 1024)   Genetic Screen 1 Screen: NEG  Antibody:Negative (09/10 1024)  Anatomic US Complete Rubella: 1.19 (09/10 1024) Varicella:    GTT Early:               Third trimester:  RPR: Non Reactive (09/10 1024)   Rhogam  HBsAg: Negative (09/10 1024)   TDaP vaccine                       Flu Shot: HIV: NR  Baby Food                                GBS:   Contraception  Pap:  CBB     CS/VBAC    Support Person               Anatomic ultrasound complete, it's a boy!    Preterm labor symptoms and general obstetric precautions including but not limited to vaginal bleeding, contractions, leaking of fluid and fetal movement were reviewed in detail with the patient.  Return in about 4 weeks (around 12/25/2017) for ROB.  Marcelyn BruinsJacelyn Westlyn Levy, CNM 11/27/2017  9:28 AM

## 2017-11-27 NOTE — Progress Notes (Signed)
No concerns.rj 

## 2017-12-11 NOTE — L&D Delivery Note (Signed)
Delivery Note Primary OB: [facility] Delivery Provider: Marcelyn BruinsJacelyn Schmid, CNM Gestational Age: Premature at 5533 weeks gestation Antepartum complications: none Intrapartum complications: none  A viable Female was delivered precipitiously via vertex presentation with RN in attendance. The umbilical cord was cut and the newborn was transferred to the [facility].  I was called to deliver the placenta.  The placenta  delivered spontaneously and was inspected and found to be intact with a three vessel cord. The cervix and vagina were inspected. There were no lacerations. The fundus was firm.   Apgars: 7, 9  Weight:  3 lb 1.4 oz.   Placenta status: spontaneous and Intact.  Cord: 3 vessels;  with the following complications: none.  Anesthesia:  none Episiotomy:  none Lacerations:  none Suture Repair: none Est. Blood Loss (mL):  less than 50 mL  Mom to postpartum.  Baby to Nursery.  Marcelyn BruinsJacelyn Schmid, CNM [facility], [facility] 02/14/2018  8:29 AM

## 2017-12-25 ENCOUNTER — Ambulatory Visit (INDEPENDENT_AMBULATORY_CARE_PROVIDER_SITE_OTHER): Payer: Managed Care, Other (non HMO) | Admitting: Obstetrics and Gynecology

## 2017-12-25 VITALS — BP 98/58 | Wt 132.0 lb

## 2017-12-25 DIAGNOSIS — Z3A26 26 weeks gestation of pregnancy: Secondary | ICD-10-CM

## 2017-12-25 DIAGNOSIS — Z3491 Encounter for supervision of normal pregnancy, unspecified, first trimester: Secondary | ICD-10-CM

## 2017-12-25 NOTE — Progress Notes (Signed)
No concerns 28 week labs next visit

## 2017-12-25 NOTE — Progress Notes (Signed)
ROB

## 2018-01-08 ENCOUNTER — Other Ambulatory Visit: Payer: Managed Care, Other (non HMO)

## 2018-01-08 ENCOUNTER — Ambulatory Visit (INDEPENDENT_AMBULATORY_CARE_PROVIDER_SITE_OTHER): Payer: Managed Care, Other (non HMO) | Admitting: Obstetrics & Gynecology

## 2018-01-08 ENCOUNTER — Encounter: Payer: Self-pay | Admitting: Obstetrics & Gynecology

## 2018-01-08 VITALS — BP 102/60 | Wt 133.0 lb

## 2018-01-08 DIAGNOSIS — Z3A28 28 weeks gestation of pregnancy: Secondary | ICD-10-CM

## 2018-01-08 DIAGNOSIS — Z3491 Encounter for supervision of normal pregnancy, unspecified, first trimester: Secondary | ICD-10-CM

## 2018-01-08 DIAGNOSIS — Z3A26 26 weeks gestation of pregnancy: Secondary | ICD-10-CM

## 2018-01-08 NOTE — Progress Notes (Signed)
  Subjective  Fetal Movement? yes Contractions? occas B-H Leaking Fluid? no Vaginal Bleeding? No Min nausea, GERD. No edema  Objective  BP 102/60   Wt 133 lb (60.3 kg)   LMP 06/25/2017   BMI 25.13 kg/m  General: NAD Pumonary: no increased work of breathing Abdomen: gravid, non-tender Extremities: no edema Psychiatric: mood appropriate, affect full  Assessment  27 y.o. G2P0101 at 7539w1d by  04/01/2018, by Last Menstrual Period presenting for routine prenatal visit  Plan   Problem List Items Addressed This Visit      Other   Supervision of low-risk pregnancy, first trimester    Other Visit Diagnoses    [redacted] weeks gestation of pregnancy    -  Primary    Labs today. PNV. [facility]. PTL precautions (prior 36 week PTD).  Annamarie MajorPaul Bonni Neuser, MD, Merlinda FrederickFACOG [facility], [facility] 01/08/2018  10:03 AM

## 2018-01-08 NOTE — Patient Instructions (Signed)
Third Trimester of Pregnancy The third trimester is from week 28 through week 40 (months 7 through 9). The third trimester is a time when the unborn baby (fetus) is growing rapidly. At the end of the ninth month, the fetus is about 20 inches in length and weighs 6-10 pounds. Body changes during your third trimester Your body will continue to go through many changes during pregnancy. The changes vary from woman to woman. During the third trimester:  Your weight will continue to increase. You can expect to gain 25-35 pounds (11-16 kg) by the end of the pregnancy.  You may begin to get stretch marks on your hips, abdomen, and breasts.  You may urinate more often because the fetus is moving lower into your pelvis and pressing on your bladder.  You may develop or continue to have heartburn. This is caused by increased hormones that slow down muscles in the digestive tract.  You may develop or continue to have constipation because increased hormones slow digestion and cause the muscles that push waste through your intestines to relax.  You may develop hemorrhoids. These are swollen veins (varicose veins) in the rectum that can itch or be painful.  You may develop swollen, bulging veins (varicose veins) in your legs.  You may have increased body aches in the pelvis, back, or thighs. This is due to weight gain and increased hormones that are relaxing your joints.  You may have changes in your hair. These can include thickening of your hair, rapid growth, and changes in texture. Some women also have hair loss during or after pregnancy, or hair that feels dry or thin. Your hair will most likely return to normal after your baby is born.  Your breasts will continue to grow and they will continue to become tender. A yellow fluid (colostrum) may leak from your breasts. This is the first milk you are producing for your baby.  Your belly button may stick out.  You may notice more swelling in your hands,  face, or ankles.  You may have increased tingling or numbness in your hands, arms, and legs. The skin on your belly may also feel numb.  You may feel short of breath because of your expanding uterus.  You may have more problems sleeping. This can be caused by the size of your belly, increased need to urinate, and an increase in your body's metabolism.  You may notice the fetus "dropping," or moving lower in your abdomen (lightening).  You may have increased vaginal discharge.  You may notice your joints feel loose and you may have pain around your pelvic bone.  What to expect at prenatal visits You will have prenatal exams every 2 weeks until week 36. Then you will have weekly prenatal exams. During a routine prenatal visit:  You will be weighed to make sure you and the baby are growing normally.  Your blood pressure will be taken.  Your abdomen will be measured to track your baby's growth.  The fetal heartbeat will be listened to.  Any test results from the previous visit will be discussed.  You may have a cervical check near your due date to see if your cervix has softened or thinned (effaced).  You will be tested for Group B streptococcus. This happens between 35 and 37 weeks.  Your health care provider may ask you:  What your birth plan is.  How you are feeling.  If you are feeling the baby move.  If you have had   any abnormal symptoms, such as leaking fluid, bleeding, severe headaches, or abdominal cramping.  If you are using any tobacco products, including cigarettes, chewing tobacco, and electronic cigarettes.  If you have any questions.  Other tests or screenings that may be performed during your third trimester include:  Blood tests that check for low iron levels (anemia).  Fetal testing to check the health, activity level, and growth of the fetus. Testing is done if you have certain medical conditions or if there are problems during the  pregnancy.  Nonstress test (NST). This test checks the health of your baby to make sure there are no signs of problems, such as the baby not getting enough oxygen. During this test, a belt is placed around your belly. The baby is made to move, and its heart rate is monitored during movement.  What is false labor? False labor is a condition in which you feel small, irregular tightenings of the muscles in the womb (contractions) that usually go away with rest, changing position, or drinking water. These are called Braxton Hicks contractions. Contractions may last for hours, days, or even weeks before true labor sets in. If contractions come at regular intervals, become more frequent, increase in intensity, or become painful, you should see your health care provider. What are the signs of labor?  Abdominal cramps.  Regular contractions that start at 10 minutes apart and become stronger and more frequent with time.  Contractions that start on the top of the uterus and spread down to the lower abdomen and back.  Increased pelvic pressure and dull back pain.  A watery or bloody mucus discharge that comes from the vagina.  Leaking of amniotic fluid. This is also known as your "water breaking." It could be a slow trickle or a gush. Let your health care provider know if it has a color or strange odor. If you have any of these signs, call your health care provider right away, even if it is before your due date. Follow these instructions at home: Medicines  Follow your health care provider's instructions regarding medicine use. Specific medicines may be either safe or unsafe to take during pregnancy.  Take a prenatal vitamin that contains at least 600 micrograms (mcg) of folic acid.  If you develop constipation, try taking a stool softener if your health care provider approves. Eating and drinking  Eat a balanced diet that includes fresh fruits and vegetables, whole grains, good sources of protein  such as meat, eggs, or tofu, and low-fat dairy. Your health care provider will help you determine the amount of weight gain that is right for you.  Avoid raw meat and uncooked cheese. These carry germs that can cause birth defects in the baby.  If you have low calcium intake from food, talk to your health care provider about whether you should take a daily calcium supplement.  Eat four or five small meals rather than three large meals a day.  Limit foods that are high in fat and processed sugars, such as fried and sweet foods.  To prevent constipation: ? Drink enough fluid to keep your urine clear or pale yellow. ? Eat foods that are high in fiber, such as fresh fruits and vegetables, whole grains, and beans. Activity  Exercise only as directed by your health care provider. Most women can continue their usual exercise routine during pregnancy. Try to exercise for 30 minutes at least 5 days a week. Stop exercising if you experience uterine contractions.  Avoid heavy   lifting.  Do not exercise in extreme heat or humidity, or at high altitudes.  Wear low-heel, comfortable shoes.  Practice good posture.  You may continue to have sex unless your health care provider tells you otherwise. Relieving pain and discomfort  Take frequent breaks and rest with your legs elevated if you have leg cramps or low back pain.  Take warm sitz baths to soothe any pain or discomfort caused by hemorrhoids. Use hemorrhoid cream if your health care provider approves.  Wear a good support bra to prevent discomfort from breast tenderness.  If you develop varicose veins: ? Wear support pantyhose or compression stockings as told by your healthcare provider. ? Elevate your feet for 15 minutes, 3-4 times a day. Prenatal care  Write down your questions. Take them to your prenatal visits.  Keep all your prenatal visits as told by your health care provider. This is important. Safety  Wear your seat belt at  all times when driving.  Make a list of emergency phone numbers, including numbers for family, friends, the hospital, and police and fire departments. General instructions  Avoid cat litter boxes and soil used by cats. These carry germs that can cause birth defects in the baby. If you have a cat, ask someone to clean the litter box for you.  Do not travel far distances unless it is absolutely necessary and only with the approval of your health care provider.  Do not use hot tubs, steam rooms, or saunas.  Do not drink alcohol.  Do not use any products that contain nicotine or tobacco, such as cigarettes and e-cigarettes. If you need help quitting, ask your health care provider.  Do not use any medicinal herbs or unprescribed drugs. These chemicals affect the formation and growth of the baby.  Do not douche or use tampons or scented sanitary pads.  Do not cross your legs for long periods of time.  To prepare for the arrival of your baby: ? Take prenatal classes to understand, practice, and ask questions about labor and delivery. ? Make a trial run to the hospital. ? Visit the hospital and tour the maternity area. ? Arrange for maternity or paternity leave through employers. ? Arrange for family and friends to take care of pets while you are in the hospital. ? Purchase a rear-facing car seat and make sure you know how to install it in your car. ? Pack your hospital bag. ? Prepare the baby's nursery. Make sure to remove all pillows and stuffed animals from the baby's crib to prevent suffocation.  Visit your dentist if you have not gone during your pregnancy. Use a soft toothbrush to brush your teeth and be gentle when you floss. Contact a health care provider if:  You are unsure if you are in labor or if your water has broken.  You become dizzy.  You have mild pelvic cramps, pelvic pressure, or nagging pain in your abdominal area.  You have lower back pain.  You have persistent  nausea, vomiting, or diarrhea.  You have an unusual or bad smelling vaginal discharge.  You have pain when you urinate. Get help right away if:  Your water breaks before 37 weeks.  You have regular contractions less than 5 minutes apart before 37 weeks.  You have a fever.  You are leaking fluid from your vagina.  You have spotting or bleeding from your vagina.  You have severe abdominal pain or cramping.  You have rapid weight loss or weight gain.    You have shortness of breath with chest pain.  You notice sudden or extreme swelling of your face, hands, ankles, feet, or legs.  Your baby makes fewer than 10 movements in 2 hours.  You have severe headaches that do not go away when you take medicine.  You have vision changes. Summary  The third trimester is from week 28 through week 40, months 7 through 9. The third trimester is a time when the unborn baby (fetus) is growing rapidly.  During the third trimester, your discomfort may increase as you and your baby continue to gain weight. You may have abdominal, leg, and back pain, sleeping problems, and an increased need to urinate.  During the third trimester your breasts will keep growing and they will continue to become tender. A yellow fluid (colostrum) may leak from your breasts. This is the first milk you are producing for your baby.  False labor is a condition in which you feel small, irregular tightenings of the muscles in the womb (contractions) that eventually go away. These are called Braxton Hicks contractions. Contractions may last for hours, days, or even weeks before true labor sets in.  Signs of labor can include: abdominal cramps; regular contractions that start at 10 minutes apart and become stronger and more frequent with time; watery or bloody mucus discharge that comes from the vagina; increased pelvic pressure and dull back pain; and leaking of amniotic fluid. This information is not intended to replace advice  given to you by your health care provider. Make sure you discuss any questions you have with your health care provider. Document Released: 11/21/2001 Document Revised: 05/04/2016 Document Reviewed: 01/28/2013 Elsevier Interactive Patient Education  2017 [employer]  

## 2018-01-09 LAB — 28 WEEK RH+PANEL
BASOS: 0 %
Basophils Absolute: 0 10*3/uL (ref 0.0–0.2)
EOS (ABSOLUTE): 0.1 10*3/uL (ref 0.0–0.4)
EOS: 1 %
GESTATIONAL DIABETES SCREEN: 117 mg/dL (ref 65–139)
HEMOGLOBIN: 12 g/dL (ref 11.1–15.9)
HIV Screen 4th Generation wRfx: NONREACTIVE
Hematocrit: 36.4 % (ref 34.0–46.6)
IMMATURE GRANULOCYTES: 0 %
Immature Grans (Abs): 0 10*3/uL (ref 0.0–0.1)
LYMPHS ABS: 2.3 10*3/uL (ref 0.7–3.1)
Lymphs: 24 %
MCH: 31 pg (ref 26.6–33.0)
MCHC: 33 g/dL (ref 31.5–35.7)
MCV: 94 fL (ref 79–97)
MONOS ABS: 0.6 10*3/uL (ref 0.1–0.9)
Monocytes: 6 %
NEUTROS ABS: 6.6 10*3/uL (ref 1.4–7.0)
Neutrophils: 69 %
Platelets: 223 10*3/uL (ref 150–379)
RBC: 3.87 x10E6/uL (ref 3.77–5.28)
RDW: 13.7 % (ref 12.3–15.4)
RPR: NONREACTIVE
WBC: 9.7 10*3/uL (ref 3.4–10.8)

## 2018-01-10 ENCOUNTER — Encounter: Payer: Self-pay | Admitting: Obstetrics and Gynecology

## 2018-01-22 ENCOUNTER — Encounter: Payer: Self-pay | Admitting: Maternal Newborn

## 2018-01-22 ENCOUNTER — Ambulatory Visit (INDEPENDENT_AMBULATORY_CARE_PROVIDER_SITE_OTHER): Payer: Managed Care, Other (non HMO) | Admitting: Maternal Newborn

## 2018-01-22 VITALS — BP 104/50 | Wt 131.0 lb

## 2018-01-22 DIAGNOSIS — Z23 Encounter for immunization: Secondary | ICD-10-CM

## 2018-01-22 DIAGNOSIS — Z3A30 30 weeks gestation of pregnancy: Secondary | ICD-10-CM

## 2018-01-22 DIAGNOSIS — Z3491 Encounter for supervision of normal pregnancy, unspecified, first trimester: Secondary | ICD-10-CM

## 2018-01-22 MED ORDER — TETANUS-DIPHTH-ACELL PERTUSSIS 5-2.5-18.5 LF-MCG/0.5 IM SUSP
0.5000 mL | Freq: Once | INTRAMUSCULAR | Status: AC
  Administered 2018-01-22: 0.5 mL via INTRAMUSCULAR

## 2018-01-22 NOTE — Progress Notes (Signed)
    Routine Prenatal Care Visit  Subjective  Samantha Levy is a 27 y.o. G2P0101 at 1865w1d being seen today for ongoing prenatal care.  She is currently monitored for the following issues for this low-risk pregnancy and has Tobacco abuse and Supervision of low-risk pregnancy, first trimester on their problem list.  ----------------------------------------------------------------------------------- Patient reports no complaints.   Contractions: Not present. Vag. Bleeding: None.  Movement: Present. Denies leaking of fluid.  ----------------------------------------------------------------------------------- The following portions of the patient's history were reviewed and updated as appropriate: allergies, current medications, past family history, past medical history, past social history, past surgical history and problem list. Problem list updated.   Objective  Blood pressure (!) 104/50, weight 131 lb (59.4 kg), last menstrual period 06/25/2017, unknown if currently breastfeeding. Pregravid weight 110 lb (49.9 kg) Total Weight Gain 21 lb (9.526 kg) Urinalysis: Urine Protein: Negative Urine Glucose: Negative  Fetal Status: Fetal Heart Rate (bpm): 136 Fundal Height: 29 cm Movement: Present     General:  Alert, oriented and cooperative. Patient is in no acute distress.  Skin: Skin is warm and dry. No rash noted.   Cardiovascular: Normal heart rate noted  Respiratory: Normal respiratory effort, no problems with respiration noted  Abdomen: Soft, gravid, appropriate for gestational age. Pain/Pressure: Absent     Pelvic:  Cervical exam deferred        Extremities: Normal range of motion.  Edema: None  Mental Status: Normal mood and affect. Normal behavior. Normal judgment and thought content.     Assessment   27 y.o. G2P0101 at 4165w1d, EDD 04/01/2018 by Last Menstrual Period presenting for routine prenatal visit.  Plan   second Problems (from 08/20/17 to present)    Problem Noted  Resolved   Tobacco abuse 08/20/2017 by Oswaldo ConroySchmid, Merritt Kibby Y, CNM No   Supervision of low-risk pregnancy, first trimester 08/20/2017 by Oswaldo ConroySchmid, Jahmir Salo Y, CNM No   Overview Addendum 01/10/2018  1:14 PM by Vena AustriaStaebler, Andreas, MD    [facility]inic [facility] Prenatal Labs  Dating LMP=5727w1d US Blood type: O/Positive/-- (09/10 1024)   Genetic Screen 1 Screen: NEG  Antibody:Negative (09/10 1024)  Anatomic US  Rubella: 1.19 (09/10 1024) Varicella:    GTT 115 RPR: Non Reactive (09/10 1024)   Rhogam N/A HBsAg: Negative (09/10 1024)   TDaP vaccine                       Flu Shot: 09/17/17 HIV: NR  Baby Food                                GBS:   Contraception  Pap:  CBB     CS/VBAC    Support Person               TDaP today.   Preterm labor symptoms and general obstetric precautions including but not limited to vaginal bleeding, contractions, leaking of fluid and fetal movement were reviewed in detail with the patient.  Return in about 2 weeks (around 02/05/2018) for ROB.  Marcelyn BruinsJacelyn Yasiel Goyne, CNM 01/22/2018  12:09 PM

## 2018-01-22 NOTE — Progress Notes (Signed)
No concerns.rj 

## 2018-02-05 ENCOUNTER — Ambulatory Visit (INDEPENDENT_AMBULATORY_CARE_PROVIDER_SITE_OTHER): Payer: Managed Care, Other (non HMO) | Admitting: Obstetrics & Gynecology

## 2018-02-05 ENCOUNTER — Telehealth: Payer: Self-pay

## 2018-02-05 VITALS — BP 100/60 | Wt 130.0 lb

## 2018-02-05 DIAGNOSIS — Z3491 Encounter for supervision of normal pregnancy, unspecified, first trimester: Secondary | ICD-10-CM

## 2018-02-05 DIAGNOSIS — Z3A32 32 weeks gestation of pregnancy: Secondary | ICD-10-CM

## 2018-02-05 NOTE — Progress Notes (Signed)
  Subjective  Fetal Movement? yes Contractions? occas B-Hs Leaking Fluid? no Vaginal Bleeding? no  Objective  BP 100/60   Wt 130 lb (59 kg)   LMP 06/25/2017   BMI 24.56 kg/m  General: NAD Pumonary: no increased work of breathing Abdomen: gravid, non-tender Extremities: no edema Psychiatric: mood appropriate, affect full  Assessment  27 y.o. G2P0101 at 331w1d by  04/01/2018, by Last Menstrual Period presenting for routine prenatal visit  Plan   Problem List Items Addressed This Visit      Other   Supervision of low-risk pregnancy, first trimester    Other Visit Diagnoses    [redacted] weeks gestation of pregnancy    -  Primary    Monitor for s/sx PTL  Samantha MajorPaul Lekendrick Alpern, MD, Merlinda FrederickFACOG [facility], [facility] 02/05/2018  10:53 AM

## 2018-02-05 NOTE — Telephone Encounter (Signed)
Pt is 32wks, thinks her mucus plug has come out. [phone].  Adv there is nothing to do but wait and see what happens.  Pt concerned b/c she lost her mucus plug c her daughter and four hours later she was in labor.  Adv it's no indication of labor.  Pt isn't having ctxs now.  Labor precautions reviewed.

## 2018-02-14 ENCOUNTER — Telehealth: Payer: Self-pay | Admitting: Maternal Newborn

## 2018-02-14 ENCOUNTER — Other Ambulatory Visit: Payer: Self-pay

## 2018-02-14 ENCOUNTER — Encounter: Payer: Self-pay | Admitting: *Deleted

## 2018-02-14 ENCOUNTER — Inpatient Hospital Stay
Admission: EM | Admit: 2018-02-14 | Discharge: 2018-02-15 | DRG: 805 | Disposition: A | Discharge status: Home / Self Care | Payer: Managed Care, Other (non HMO) | Attending: Obstetrics & Gynecology | Admitting: Obstetrics & Gynecology

## 2018-02-14 DIAGNOSIS — O99334 Smoking (tobacco) complicating childbirth: Secondary | ICD-10-CM | POA: Diagnosis present

## 2018-02-14 DIAGNOSIS — F17200 Nicotine dependence, unspecified, uncomplicated: Secondary | ICD-10-CM | POA: Diagnosis present

## 2018-02-14 DIAGNOSIS — Z3A33 33 weeks gestation of pregnancy: Secondary | ICD-10-CM

## 2018-02-14 DIAGNOSIS — Z720 Tobacco use: Secondary | ICD-10-CM

## 2018-02-14 DIAGNOSIS — Z3491 Encounter for supervision of normal pregnancy, unspecified, first trimester: Secondary | ICD-10-CM

## 2018-02-14 LAB — CBC
HEMATOCRIT: 40.7 % (ref 35.0–47.0)
HEMOGLOBIN: 13.6 g/dL (ref 12.0–16.0)
MCH: 31.1 pg (ref 26.0–34.0)
MCHC: 33.4 g/dL (ref 32.0–36.0)
MCV: 93.2 fL (ref 80.0–100.0)
Platelets: 185 10*3/uL (ref 150–440)
RBC: 4.37 MIL/uL (ref 3.80–5.20)
RDW: 14 % (ref 11.5–14.5)
WBC: 11.1 10*3/uL — AB (ref 3.6–11.0)

## 2018-02-14 LAB — TYPE AND SCREEN
ABO/RH(D): O POS
ANTIBODY SCREEN: NEGATIVE

## 2018-02-14 LAB — CHLAMYDIA/NGC RT PCR (ARMC ONLY)
CHLAMYDIA TR: NOT DETECTED
N GONORRHOEAE: NOT DETECTED

## 2018-02-14 MED ORDER — ACETAMINOPHEN 325 MG PO TABS: 650.0000 mg | ORAL_TABLET | ORAL | Status: DC | PRN

## 2018-02-14 MED ORDER — OXYCODONE-ACETAMINOPHEN 5-325 MG PO TABS: 1.0000 | ORAL_TABLET | ORAL | Status: DC | PRN

## 2018-02-14 MED ORDER — OXYCODONE-ACETAMINOPHEN 5-325 MG PO TABS: 2.0000 | ORAL_TABLET | ORAL | Status: DC | PRN

## 2018-02-14 MED ORDER — PRENATAL MULTIVITAMIN CH
1.0000 | ORAL_TABLET | Freq: Every day | ORAL | Status: DC
  Administered 2018-02-14 – 2018-02-15 (×2): 1 via ORAL
  Filled 2018-02-14 (×2): qty 1

## 2018-02-14 MED ORDER — ONDANSETRON HCL 4 MG PO TABS: 4.0000 mg | ORAL_TABLET | ORAL | Status: DC | PRN

## 2018-02-14 MED ORDER — OXYTOCIN 10 UNIT/ML IJ SOLN: 10.0000 [IU] | Freq: Once | INTRAMUSCULAR | Status: DC

## 2018-02-14 MED ORDER — DIPHENHYDRAMINE HCL 25 MG PO CAPS: 25.0000 mg | ORAL_CAPSULE | Freq: Four times a day (QID) | ORAL | Status: DC | PRN

## 2018-02-14 MED ORDER — WITCH HAZEL-GLYCERIN EX PADS: 1.0000 "application " | MEDICATED_PAD | CUTANEOUS | Status: DC | PRN

## 2018-02-14 MED ORDER — SENNOSIDES-DOCUSATE SODIUM 8.6-50 MG PO TABS: 2.0000 | ORAL_TABLET | ORAL | Status: DC

## 2018-02-14 MED ORDER — ONDANSETRON HCL 4 MG/2ML IJ SOLN: 4.0000 mg | INTRAMUSCULAR | Status: DC | PRN

## 2018-02-14 MED ORDER — SIMETHICONE 80 MG PO CHEW: 80.0000 mg | CHEWABLE_TABLET | ORAL | Status: DC | PRN

## 2018-02-14 MED ORDER — IBUPROFEN 600 MG PO TABS
600.0000 mg | ORAL_TABLET | Freq: Four times a day (QID) | ORAL | Status: DC
  Administered 2018-02-14 – 2018-02-15 (×4): 600 mg via ORAL
  Filled 2018-02-14 (×5): qty 1

## 2018-02-14 MED ORDER — DIBUCAINE 1 % RE OINT: 1.0000 "application " | TOPICAL_OINTMENT | RECTAL | Status: DC | PRN

## 2018-02-14 MED ORDER — COCONUT OIL OIL: 1.0000 "application " | TOPICAL_OIL | Status: DC | PRN

## 2018-02-14 MED ORDER — BENZOCAINE-MENTHOL 20-0.5 % EX AERO: 1.0000 "application " | INHALATION_SPRAY | CUTANEOUS | Status: DC | PRN

## 2018-02-14 NOTE — Telephone Encounter (Signed)
Called and left voicemail for patient to be schedule for 6 week pp with [place] Bing[place]i

## 2018-02-14 NOTE — Telephone Encounter (Signed)
-----   Message from Nadara Mustardobert P Harris, MD sent at 02/14/2018  8:07 AM EST ----- Regarding: appt Cancel appt 02/19/18 and schedule her a POST PARTUM appointment with Maryruth EveJaci Schmid in 4 weeks

## 2018-02-14 NOTE — Discharge Summary (Signed)
OB Discharge Summary     Patient Name: Samantha Levy DOB: 06/10/1991 MRN: [id number]  Date of admission: 02/14/2018 Delivering MD: Samantha Levy, CNM  Date of Delivery: 02/14/2018  Date of discharge:   Admitting diagnosis: 33 wks Contractions Intrauterine pregnancy: 5538w3d     Secondary diagnosis: None     Discharge diagnosis: Preterm Pregnancy Delivered                         Hospital course:  Onset of Labor With Vaginal Delivery     27 Levy.o. yo G2P0101 at 9138w3d was admitted in Active Labor on 02/14/2018. Patient had an uncomplicated labor course as follows:  Membrane Rupture Time/Date: 7:12 AM ,02/14/2018   Intrapartum Procedures: Episiotomy:                                           Lacerations:     Patient had a delivery of a Viable infant. 02/14/2018  Information for the patient's newborn:  Samantha Levy, Boy Samantha Levy [[id number]]  Delivery Method: Vaginal, Spontaneous(Filed from Delivery Summary)    Pateint had an uncomplicated postpartum course.  She is ambulating, tolerating a regular diet, passing flatus, and urinating well. Patient is discharged home in stable condition on 02/15/18.                                                                  Post partum procedures:none  Complications: None  Physical exam on 02/15/2018: Vitals:   02/14/18 1101 02/14/18 2004 02/14/18 2358 02/15/18 0748  BP: (!) 100/58 (!) 109/56 (!) 109/57 99/63  Pulse: 64 69 61 71  Resp: 18 18 20 18   Temp: 98 F (36.7 C) 98 F (36.7 C) (!) 97.4 F (36.3 C) 97.8 F (36.6 C)  TempSrc: Oral Oral Oral Oral  SpO2: 100% 99% 99% 99%  Weight:      Height:       General: alert, cooperative and no distress Lochia: appropriate Uterine Fundus: firm Incision: N/A DVT Evaluation: No evidence of DVT seen on physical exam.  Labs: Lab Results  Component Value Date   WBC 11.3 (H) 02/15/2018   HGB 12.7 02/15/2018   HCT 37.8 02/15/2018   MCV 94.3 02/15/2018   PLT 184 02/15/2018   No flowsheet data  found.  Discharge instruction: per After Visit Summary.  Medications:  Allergies as of 02/15/2018   No Known Allergies     Medication List    TAKE these medications   multivitamin-prenatal 27-0.8 MG Tabs tablet Take 1 tablet by mouth daily at 12 noon.       Diet: routine diet  Activity: Advance as tolerated. Pelvic rest for 6 weeks.   Outpatient follow up: Follow-up Information    Samantha Levy, Samantha Levy, CNM. Schedule an appointment as soon as possible for a visit in 4 week(s).   Specialty:  Certified Nurse Midwife Why:  postpartum follow up visit and birth control conference Contact information: [address] [place] [address] [phone]             Postpartum contraception: Undecided Rhogam Given postpartum: no Rubella vaccine given postpartum: no Varicella vaccine given  postpartum: no TDaP given antepartum or postpartum: Yes  Newborn Data: Live born female  Birth Weight: 3 lb 1.4 oz (1400 g) APGAR: 7, 9  Newborn Delivery   Birth date/time:  02/14/2018 07:12:00 Delivery type:  Vaginal, Spontaneous      Baby Feeding: pumping and feeding breast milk  Disposition:NICU  SIGNED: Tresea Mall, CNM

## 2018-02-14 NOTE — H&P (Signed)
Obstetrics Admission History & Physical   Preterm labor  HPI:  27 y.o. G2P0101 @ 973w3d (04/01/2018, by Last Menstrual Period). Admitted on 02/14/2018:   Patient Active Problem List   Diagnosis Date Noted  . Preterm labor 02/14/2018  . Tobacco abuse 08/20/2017  . Supervision of low-risk pregnancy, first trimester 08/20/2017     Presents for contractions, began last night at work around 1900 and then stopped. Began again today at 0400, and she presented to triage where she delivered precipitously with SROM at delivery. Normal placental delivery with intact placenta and cord.  [facility]. Pregnancy complicated by tobacco use.  ROS: A review of systems was performed and negative, except as stated in the above HPI. PMHx:  Past Medical History:  Diagnosis Date  . Medical history non-contributory    PSHx:  Past Surgical History:  Procedure Laterality Date  . NO PAST SURGERIES     Medications:  Medications Prior to Admission  Medication Sig Dispense Refill Last Dose  . Prenatal Vit-Fe Fumarate-FA (MULTIVITAMIN-PRENATAL) 27-0.8 MG TABS tablet Take 1 tablet by mouth daily at 12 noon.   02/13/2018 at Unknown time   Allergies: has No Known Allergies. OBHx:  OB History  Gravida Para Term Preterm AB Living  2 1   1   1   SAB TAB Ectopic Multiple Live Births        0 1    # Outcome Date GA Lbr Len/2nd Weight Sex Delivery Anes PTL Lv  2 Current           1 Preterm 12/16/15 2570w2d 03:42 / 01:27 6 lb 3.1 oz (2.81 kg) F Vag-Spont Spinal  LIV     Birth Comments: vernix covers entire body     MWN:UUVOZDGU/YQIHKVQQVZDGFHx:Negative/unremarkable except as detailed in HPI.Marland Kitchen.  No family history of birth defects. Soc Hx: Current smoker, Alcohol: none, Recreational drug use: none, Denies domestic abuse and Pregnancy welcomed  Objective:   Vitals:   02/14/18 0738  BP: 109/72  Pulse: (!) 55  Resp: 16  Temp: 98 F (36.7 C)  SpO2: 100%   Constitutional: Well nourished, well developed female in no  acute distress.  HEENT: normal Skin: Warm and dry.  Extremity: trace to 1+ bilateral pedal edema Respiratory: Normal respiratory effort Abdomen: mild (afterpains) Neuro:  Cranial nerves grossly intact Psych: Alert and Oriented x3. No memory deficits. Normal mood and affect.  MS: normal bilateral lower extremity ROM/strength/stability. No signs of DVT  Pelvic exam: External Genitalia, Bartholin's glands, Urethra, Skene's glands: within normal limits Vagina: no perineal lacerations, small hemostatic left vaginal abrasion, no repair Cervix: no lacerations Uterus: firm, minimal lochia Adnexa: not evaluated    Perinatal info:  Blood type: O positive Rubella - Immune Varicella - Immune TDaP Given during third trimester of this pregnancy RPR NR / HIV Neg/ HBsAg Neg   Assessment & Plan:   27 y.o. L8V5643G2P0101 @ 413w3d, Admitted on 02/14/2018:Preterm labor and precipitous delivery.  Postpartum care and labs ordered.  Marcelyn BruinsJacelyn Schmid, CNM [facility], [facility] 02/14/2018  8:18 AM

## 2018-02-15 ENCOUNTER — Encounter: Payer: Self-pay | Admitting: Emergency Medicine

## 2018-02-15 LAB — CBC
HCT: 37.8 % (ref 35.0–47.0)
Hemoglobin: 12.7 g/dL (ref 12.0–16.0)
MCH: 31.6 pg (ref 26.0–34.0)
MCHC: 33.5 g/dL (ref 32.0–36.0)
MCV: 94.3 fL (ref 80.0–100.0)
PLATELETS: 184 10*3/uL (ref 150–440)
RBC: 4.01 MIL/uL (ref 3.80–5.20)
RDW: 14.1 % (ref 11.5–14.5)
WBC: 11.3 10*3/uL — AB (ref 3.6–11.0)

## 2018-02-15 LAB — SURGICAL PATHOLOGY

## 2018-02-15 NOTE — Telephone Encounter (Signed)
Called and left voicemail for patient to be schedule

## 2018-02-15 NOTE — Progress Notes (Signed)
Reviewed all patients discharge instructions and handouts regarding postpartum bleeding, no intercourse for 6 weeks, signs and symptoms of mastitis and postpartum bleu's. Infant remains in SCN. Provided patient with pump supplies and labels for breastmilk. All questions have been answered at this time. Patient insisted on walking herself down to the car.

## 2018-02-19 ENCOUNTER — Encounter: Payer: Managed Care, Other (non HMO) | Admitting: Obstetrics and Gynecology

## 2018-03-05 ENCOUNTER — Telehealth: Payer: Self-pay

## 2018-03-05 ENCOUNTER — Ambulatory Visit: Payer: Self-pay

## 2018-03-05 NOTE — Lactation Note (Signed)
This note was copied from a baby's chart. Lactation Consultation Note  Patient Name: Samantha Levy YMEBR'A Date: 03/05/2018   I was asked by [name] to assist this mom with her first "breastfeeding"-lick/learn session today at 1530. Baby was quietly alert. He was first given to mom in the cradle hold which brought his chin to his chest and poor alignment. Mom agreed to try the football hold with a lot of pillow support to hold his body up close to Mom's. I showed mom how to use her arm near baby to give him more body support so his nose and knees are facing mom without any twisting of the body. He tolerated this movement fairly well with VSS. We did cover the some of his head and exposed ear to muffle sound and light. He did lick and nuzzle off and on for a few minutes, and even a couple of non-nutritive sucks. Mom encouraged to do this with any of the tube feedings so he can start to associate full stomach with the breast.   Mom has also been using a personal use breast pump for all pumping. I gave her a Symphony breast pump kit and moved a Symphony pump crib side so she could use that while visiting him. I reviewed how to pump as it sounds like she sometimes pumps 20-30 minutes each time. I encouraged her to have me (any LC ) observe next pumping session to make sure flanges are correct , etc. I also taught her hand expression of breast milk and encouraged her to use [employer] APP called :My NICU BABY" to track feeds, pumpings and get NICU info for parents.      Maternal Data    Feeding Feeding Type: Breast Milk  LATCH Score                   Interventions    Lactation Tools Discussed/Used     Consult Status      [name] 03/05/2018, 4:09 PM

## 2018-03-05 NOTE — Telephone Encounter (Signed)
FMLA/DISABILITY forms (2) for [employer] filled out, signature obtained and given to [place] for processing.

## 2018-03-08 ENCOUNTER — Telehealth: Payer: Self-pay | Admitting: Advanced Practice Midwife

## 2018-03-08 NOTE — Telephone Encounter (Signed)
Left message on patient's voice mail letting her know that her FMLA paperwork has been faxed to [employer].

## 2018-03-28 ENCOUNTER — Encounter: Payer: Self-pay | Admitting: Obstetrics & Gynecology

## 2018-03-28 ENCOUNTER — Ambulatory Visit (INDEPENDENT_AMBULATORY_CARE_PROVIDER_SITE_OTHER): Payer: Managed Care, Other (non HMO) | Admitting: Obstetrics & Gynecology

## 2018-03-28 MED ORDER — BUPROPION HCL ER (SMOKING DET) 150 MG PO TB12
150.0000 mg | ORAL_TABLET | Freq: Two times a day (BID) | ORAL | 5 refills | Status: DC
Start: 2018-03-28 — End: 2020-10-04

## 2018-03-28 NOTE — Progress Notes (Signed)
  OBSTETRICS POSTPARTUM CLINIC PROGRESS NOTE  Subjective:     Samantha Levy is a 27 y.o. [phone] female who presents for a postpartum visit. She is 6 weeks postpartum following a Preterm pregnancy <37 weeks and delivery by Vaginal, no problems at delivery.  I have fully reviewed the prenatal and intrapartum course. Anesthesia: none.  Postpartum course has been complicated by uncomplicated.  Baby is feeding by Breast.  Bleeding: patient has not  resumed menses.  Bowel function is normal. Bladder function is normal.  Patient is not sexually active. Contraception method desired is none at this time.  Has never used birth control..  Postpartum depression screening: negative. Edinburgh 9.  The following portions of the patient's history were reviewed and updated as appropriate: allergies, current medications, past family history, past medical history, past social history, past surgical history and problem list.  Review of Systems Pertinent items noted in HPI and remainder of comprehensive ROS otherwise negative.  Objective:    BP 110/80   Ht 5\' 1"  (1.549 m)   Wt 113 lb (51.3 kg)   BMI 21.35 kg/m   General:  alert and no distress   Breasts:  inspection negative, no nipple discharge or bleeding, no masses or nodularity palpable  Lungs: clear to auscultation bilaterally  Heart:  regular rate and rhythm, S1, S2 normal, no murmur, click, rub or gallop  Abdomen: soft, non-tender; bowel sounds normal; no masses,  no organomegaly.     Vulva:  normal  Vagina: normal vagina, no discharge, exudate, lesion, or erythema  Cervix:  no cervical motion tenderness and no lesions  Corpus: normal size, contour, position, consistency, mobility, non-tender  Adnexa:  normal adnexa and no mass, fullness, tenderness  Rectal Exam: Not performed.          Assessment:  Post Partum Care visit 1. Preterm labor with preterm delivery, single or unspecified fetus.  33 week delivery, precipitous.  2.  Postpartum care and examination  Plan:  See orders and Patient Instructions Advised to stop smoking Contraceptive counseling for no method, talked about all options safe while breast feeding Zyban pros and cons discussed, info gv; eRx Follow up in: 5 months or as needed.  PAP in Sept Annamarie MajorPaul Tyray Proch, MD, Samantha FrederickFACOG [facility], [facility] 03/28/2018  1:55 PM

## 2018-03-28 NOTE — Patient Instructions (Signed)
Bupropion sustained-release tablets (smoking cessation) What is this medicine? BUPROPION (byoo PROE pee on) is used to help people quit smoking. This medicine may be used for other purposes; ask your health care provider or pharmacist if you have questions. COMMON BRAND NAME(S): Buproban, Zyban What should I tell my health care provider before I take this medicine? They need to know if you have any of these conditions: -an eating disorder, such as anorexia or bulimia -bipolar disorder or psychosis -diabetes or high blood sugar, treated with medication -glaucoma -head injury or brain tumor -heart disease, previous heart attack, or irregular heart beat -high blood pressure -kidney or liver disease -seizures -suicidal thoughts or a previous suicide attempt -Tourette's syndrome -weight loss -an unusual or allergic reaction to bupropion, other medicines, foods, dyes, or preservatives -breast-feeding -pregnant or trying to become pregnant How should I use this medicine? Take this medicine by mouth with a glass of water. Follow the directions on the prescription label. You can take it with or without food. If it upsets your stomach, take it with food. Do not cut, crush or chew this medicine. Take your medicine at regular intervals. If you take this medicine more than once a day, take your second dose at least 8 hours after you take your first dose. To limit difficulty in sleeping, avoid taking this medicine at bedtime. Do not take your medicine more often than directed. Do not stop taking this medicine suddenly except upon the advice of your doctor. Stopping this medicine too quickly may cause serious side effects. A special MedGuide will be given to you by the pharmacist with each prescription and refill. Be sure to read this information carefully each time. Talk to your pediatrician regarding the use of this medicine in children. Special care may be needed. Overdosage: If you think you have  taken too much of this medicine contact a poison control center or emergency room at once. NOTE: This medicine is only for you. Do not share this medicine with others. What if I miss a dose? If you miss a dose, skip the missed dose and take your next tablet at the regular time. There should be at least 8 hours between doses. Do not take double or extra doses. What may interact with this medicine? Do not take this medicine with any of the following medications: -linezolid -MAOIs like Azilect, Carbex, Eldepryl, Marplan, Nardil, and Parnate -methylene blue (injected into a vein) -other medicines that contain bupropion like Wellbutrin This medicine may also interact with the following medications: -alcohol -certain medicines for anxiety or sleep -certain medicines for blood pressure like metoprolol, propranolol -certain medicines for depression or psychotic disturbances -certain medicines for HIV or AIDS like efavirenz, lopinavir, nelfinavir, ritonavir -certain medicines for irregular heart beat like propafenone, flecainide -certain medicines for Parkinson's disease like amantadine, levodopa -certain medicines for seizures like carbamazepine, phenytoin, phenobarbital -cimetidine -clopidogrel -cyclophosphamide -digoxin -furazolidone -isoniazid -nicotine -orphenadrine -procarbazine -steroid medicines like prednisone or cortisone -stimulant medicines for attention disorders, weight loss, or to stay awake -tamoxifen -theophylline -thiotepa -ticlopidine -tramadol -warfarin This list may not describe all possible interactions. Give your health care provider a list of all the medicines, herbs, non-prescription drugs, or dietary supplements you use. Also tell them if you smoke, drink alcohol, or use illegal drugs. Some items may interact with your medicine. What should I watch for while using this medicine? Visit your doctor or health care professional for regular checks on your progress.  This medicine should be used together with a   patient support program. It is important to participate in a behavioral program, counseling, or other support program that is recommended by your health care professional. Patients and their families should watch out for new or worsening thoughts of suicide or depression. Also watch out for sudden changes in feelings such as feeling anxious, agitated, panicky, irritable, hostile, aggressive, impulsive, severely restless, overly excited and hyperactive, or not being able to sleep. If this happens, especially at the beginning of treatment or after a change in dose, call your health care professional. Avoid alcoholic drinks while taking this medicine. Drinking excessive alcoholic beverages, using sleeping or anxiety medicines, or quickly stopping the use of these agents while taking this medicine may increase your risk for a seizure. Do not drive or use heavy machinery until you know how this medicine affects you. This medicine can impair your ability to perform these tasks. Do not take this medicine close to bedtime. It may prevent you from sleeping. Your mouth may get dry. Chewing sugarless gum or sucking hard candy, and drinking plenty of water may help. Contact your doctor if the problem does not go away or is severe. Do not use nicotine patches or chewing gum without the advice of your doctor or health care professional while taking this medicine. You may need to have your blood pressure taken regularly if your doctor recommends that you use both nicotine and this medicine together. What side effects may I notice from receiving this medicine? Side effects that you should report to your doctor or health care professional as soon as possible: -allergic reactions like skin rash, itching or hives, swelling of the face, lips, or tongue -breathing problems -changes in vision -confusion -elevated mood, decreased need for sleep, racing thoughts, impulsive  behavior -fast or irregular heartbeat -hallucinations, loss of contact with reality -increased blood pressure -redness, blistering, peeling or loosening of the skin, including inside the mouth -seizures -suicidal thoughts or other mood changes -unusually weak or tired -vomiting Side effects that usually do not require medical attention (report to your doctor or health care professional if they continue or are bothersome): -constipation -headache -loss of appetite -nausea -tremors -weight loss This list may not describe all possible side effects. Call your doctor for medical advice about side effects. You may report side effects to FDA at 1-800-FDA-1088. Where should I keep my medicine? Keep out of the reach of children. Store at room temperature between 20 and 25 degrees C (68 and 77 degrees F). Protect from light. Keep container tightly closed. Throw away any unused medicine after the expiration date. NOTE: This sheet is a summary. It may not cover all possible information. If you have questions about this medicine, talk to your doctor, pharmacist, or health care provider.  2018 Elsevier/Gold Standard (2016-05-19 13:49:28)  

## 2018-12-11 NOTE — L&D Delivery Note (Addendum)
Date of delivery: 09/17/2019 Estimated Date of Delivery: 09/23/19 Patient's last menstrual period was 12/17/2018. EGA: [redacted]w[redacted]d  Delivery Note At 6:26 AM a viable female was delivered via Vaginal, Spontaneous (Presentation: cephalic, compound with hand/arm).  APGAR: 6, 9; weight -pending skin to skin.   Placenta status: spontaneous, intact, with an area of infarction <10% of placental bed.  Marginal cord insertion 1.8cm from edge. 3 vessel Cord.  Complications: none apparent.  Cord pH: not collected.  Anesthesia:  None Episiotomy: None Lacerations: None Suture Repair: None Est. Blood Loss (mL): 10cc    Mom presented to L&D with SROM, found to be 4cm with category 2 tracing, and precipitously progressed to complete, second stage: 44mins. RN attended compound delivery of fetal head and arm, followed quickly by rest of body delivered without difficulty.  Cord was clamped immediately by neonatal RN due to poor tone. Apgar at 1 minute was 6 (tone, color).  Within next minute baby perked up and was placed skin to skin with mom. I arrived at this time, and placenta had not yet been delivered. Placenta spontaneously delivered, intact.   IM pitocin given for hemorrhage prophylaxis.  We sang happy birthday to baby Tommi Rumps or Shawna Orleans, currently known as "Rufus".   Mom to postpartum.  Baby to Couplet care / Skin to Skin.  COVID testing was not performed prior to delivery.  All staff wearing N95 masks.  Chelsea C Ward 09/17/2019, 7:09 AM

## 2019-03-20 LAB — OB RESULTS CONSOLE VARICELLA ZOSTER ANTIBODY, IGG: Varicella: IMMUNE

## 2019-03-20 LAB — OB RESULTS CONSOLE RUBELLA ANTIBODY, IGM: Rubella: IMMUNE

## 2019-05-06 ENCOUNTER — Observation Stay
Admission: EM | Admit: 2019-05-06 | Discharge: 2019-05-06 | Disposition: A | Discharge status: Home / Self Care | Payer: Medicaid Other | Attending: Obstetrics and Gynecology | Admitting: Obstetrics and Gynecology

## 2019-05-06 ENCOUNTER — Other Ambulatory Visit: Payer: Self-pay

## 2019-05-06 DIAGNOSIS — Z3490 Encounter for supervision of normal pregnancy, unspecified, unspecified trimester: Secondary | ICD-10-CM

## 2019-05-06 DIAGNOSIS — Z3A20 20 weeks gestation of pregnancy: Secondary | ICD-10-CM | POA: Diagnosis not present

## 2019-05-06 DIAGNOSIS — O26892 Other specified pregnancy related conditions, second trimester: Secondary | ICD-10-CM | POA: Diagnosis present

## 2019-05-06 DIAGNOSIS — N898 Other specified noninflammatory disorders of vagina: Secondary | ICD-10-CM | POA: Diagnosis not present

## 2019-05-06 LAB — URINALYSIS, ROUTINE W REFLEX MICROSCOPIC
Bilirubin Urine: NEGATIVE
Glucose, UA: NEGATIVE mg/dL
Hgb urine dipstick: NEGATIVE
Ketones, ur: NEGATIVE mg/dL
Leukocytes,Ua: NEGATIVE
Nitrite: NEGATIVE
Protein, ur: NEGATIVE mg/dL
Specific Gravity, Urine: 1.006 (ref 1.005–1.030)
pH: 7 (ref 5.0–8.0)

## 2019-05-06 NOTE — OB Triage Note (Signed)
Pt. presents with unspecified leakage of moderate watery fluid since this AM. States she is unsure if it is amniotic fluid or just "normal" pregnancy discharge. Pt. has a history of preterm labor and delivery, including PPROM with her first child. Due to gestational age, a doppler fetal heart tone was taken rather than monitors applied: FHT 162. Denies vaginal bleeding.

## 2019-05-06 NOTE — Progress Notes (Signed)
TRIAGE VISIT with NST  CC: Leaking fluid in pregnancy   Samantha Levy is a 28 y.o. [id number]. She is at [redacted]w[redacted]d gestation.  S: Presenting with moderate watery discharge starting at 1pm today. Prior hx of preterm delivery x2 at 33wks with PPROM and 36wks. On 17-OH-P.   Last intercourse last night. No vaginal bleeding, no cramping.  O:  BP 113/63 (BP Location: Right Arm)   Pulse 92   Temp 98.7 F (37.1 C) (Oral)   Resp 16   LMP 12/17/2018  No results found for this or any previous visit (from the past 48 hour(s)).   Gen: NAD, AAOx3      Abd: FNTTP      Ext: Non-tender, Nonedmeatous    Doppler: 162 TOCO: quiet Cervix: visually closed. Moderate amount of discharge in vaginal vault, no clear fluid from os, even with fundal pressure.   Bedside ultrasound: MVP: 4.7 x 2.2 cm Fetal activity noted, transverse, posterior placenta Cervix 4cm on abdominal ultrasound. Fluid subjectively wnl  Ferning negative Wet prep wnl Nitrazine paper no longer available  A/P:  28 y.o. [id number] at [redacted]w[redacted]d with R/o PPROM.   Labor: not present.   R/o ROM: SSE negative x 3. Wet prep sent. Normal amount of white discharge. BSUS shows 4 x 2 cm SDP. Perfectly normal fluid.  ROM+ and formal wet prep pending. Reassurance given.

## 2019-05-06 NOTE — OB Triage Note (Signed)
Discharge instructions provided and reviewed.  Pt to follow up in office for ultrasound in 2 days.  Pt verbalizes understanding.

## 2019-09-09 LAB — OB RESULTS CONSOLE RPR: RPR: NONREACTIVE

## 2019-09-09 LAB — OB RESULTS CONSOLE HIV ANTIBODY (ROUTINE TESTING): HIV: NONREACTIVE

## 2019-09-09 LAB — OB RESULTS CONSOLE GC/CHLAMYDIA
Chlamydia: NEGATIVE
Gonorrhea: NEGATIVE

## 2019-09-09 LAB — OB RESULTS CONSOLE GBS: GBS: NEGATIVE

## 2019-09-17 ENCOUNTER — Inpatient Hospital Stay
Admission: EM | Admit: 2019-09-17 | Discharge: 2019-09-18 | DRG: 807 | Disposition: A | Discharge status: Home / Self Care | Payer: Medicaid Other | Attending: Obstetrics & Gynecology | Admitting: Obstetrics & Gynecology

## 2019-09-17 ENCOUNTER — Other Ambulatory Visit: Payer: Self-pay

## 2019-09-17 DIAGNOSIS — Z3A39 39 weeks gestation of pregnancy: Secondary | ICD-10-CM | POA: Diagnosis not present

## 2019-09-17 DIAGNOSIS — Z20828 Contact with and (suspected) exposure to other viral communicable diseases: Secondary | ICD-10-CM | POA: Diagnosis present

## 2019-09-17 DIAGNOSIS — O26893 Other specified pregnancy related conditions, third trimester: Secondary | ICD-10-CM | POA: Diagnosis present

## 2019-09-17 DIAGNOSIS — Z87891 Personal history of nicotine dependence: Secondary | ICD-10-CM

## 2019-09-17 DIAGNOSIS — O43123 Velamentous insertion of umbilical cord, third trimester: Principal | ICD-10-CM | POA: Diagnosis present

## 2019-09-17 LAB — CBC
HCT: 34.3 % — ABNORMAL LOW (ref 36.0–46.0)
Hemoglobin: 11.3 g/dL — ABNORMAL LOW (ref 12.0–15.0)
MCH: 30.2 pg (ref 26.0–34.0)
MCHC: 32.9 g/dL (ref 30.0–36.0)
MCV: 91.7 fL (ref 80.0–100.0)
Platelets: 191 10*3/uL (ref 150–400)
RBC: 3.74 MIL/uL — ABNORMAL LOW (ref 3.87–5.11)
RDW: 14.1 % (ref 11.5–15.5)
WBC: 10.1 10*3/uL (ref 4.0–10.5)
nRBC: 0 % (ref 0.0–0.2)

## 2019-09-17 LAB — SARS CORONAVIRUS 2 BY RT PCR (HOSPITAL ORDER, PERFORMED IN ~~LOC~~ HOSPITAL LAB): SARS Coronavirus 2: NEGATIVE

## 2019-09-17 LAB — TYPE AND SCREEN
ABO/RH(D): O POS
Antibody Screen: NEGATIVE

## 2019-09-17 LAB — RPR: RPR Ser Ql: NONREACTIVE

## 2019-09-17 MED ORDER — DIBUCAINE (PERIANAL) 1 % EX OINT
1.0000 "application " | TOPICAL_OINTMENT | CUTANEOUS | Status: DC | PRN
  Filled 2019-09-17: qty 28

## 2019-09-17 MED ORDER — WITCH HAZEL-GLYCERIN EX PADS
1.0000 "application " | MEDICATED_PAD | CUTANEOUS | Status: DC
  Administered 2019-09-17: 1 via TOPICAL
  Filled 2019-09-17: qty 100

## 2019-09-17 MED ORDER — IBUPROFEN 600 MG PO TABS
600.0000 mg | ORAL_TABLET | Freq: Four times a day (QID) | ORAL | Status: DC
  Administered 2019-09-17 – 2019-09-18 (×5): 600 mg via ORAL
  Filled 2019-09-17 (×4): qty 1

## 2019-09-17 MED ORDER — IBUPROFEN 600 MG PO TABS
ORAL_TABLET | ORAL | Status: AC
  Filled 2019-09-17: qty 1

## 2019-09-17 MED ORDER — ACETAMINOPHEN 500 MG PO TABS
ORAL_TABLET | ORAL | Status: AC
  Filled 2019-09-17: qty 2

## 2019-09-17 MED ORDER — SIMETHICONE 80 MG PO CHEW: 80.0000 mg | CHEWABLE_TABLET | ORAL | Status: DC | PRN

## 2019-09-17 MED ORDER — PRENATAL MULTIVITAMIN CH
1.0000 | ORAL_TABLET | Freq: Every day | ORAL | Status: DC
  Administered 2019-09-17 – 2019-09-18 (×2): 1 via ORAL
  Filled 2019-09-17 (×2): qty 1

## 2019-09-17 MED ORDER — DOCUSATE SODIUM 100 MG PO CAPS
100.0000 mg | ORAL_CAPSULE | Freq: Two times a day (BID) | ORAL | Status: DC
  Administered 2019-09-17 – 2019-09-18 (×2): 100 mg via ORAL
  Filled 2019-09-17 (×2): qty 1

## 2019-09-17 MED ORDER — ONDANSETRON HCL 4 MG PO TABS: 4.0000 mg | ORAL_TABLET | ORAL | Status: DC | PRN

## 2019-09-17 MED ORDER — MISOPROSTOL 200 MCG PO TABS
ORAL_TABLET | ORAL | Status: AC
  Filled 2019-09-17: qty 4

## 2019-09-17 MED ORDER — AMMONIA AROMATIC IN INHA
RESPIRATORY_TRACT | Status: AC
  Filled 2019-09-17: qty 10

## 2019-09-17 MED ORDER — BUPROPION HCL ER (SR) 150 MG PO TB12
150.0000 mg | ORAL_TABLET | Freq: Every day | ORAL | Status: DC
  Administered 2019-09-17 – 2019-09-18 (×2): 150 mg via ORAL
  Filled 2019-09-17 (×2): qty 1

## 2019-09-17 MED ORDER — COCONUT OIL OIL: 1.0000 "application " | TOPICAL_OIL | Status: DC | PRN

## 2019-09-17 MED ORDER — ONDANSETRON HCL 4 MG/2ML IJ SOLN: 4.0000 mg | INTRAMUSCULAR | Status: DC | PRN

## 2019-09-17 MED ORDER — LIDOCAINE HCL (PF) 1 % IJ SOLN
INTRAMUSCULAR | Status: AC
  Filled 2019-09-17: qty 30

## 2019-09-17 MED ORDER — OXYTOCIN 40 UNITS IN NORMAL SALINE INFUSION - SIMPLE MED
INTRAVENOUS | Status: AC
  Filled 2019-09-17: qty 1000

## 2019-09-17 MED ORDER — DIPHENHYDRAMINE HCL 25 MG PO CAPS: 25.0000 mg | ORAL_CAPSULE | Freq: Four times a day (QID) | ORAL | Status: DC | PRN

## 2019-09-17 MED ORDER — BENZOCAINE-MENTHOL 20-0.5 % EX AERO
1.0000 "application " | INHALATION_SPRAY | CUTANEOUS | Status: DC | PRN
  Administered 2019-09-17: 1 via TOPICAL
  Filled 2019-09-17: qty 56

## 2019-09-17 MED ORDER — ACETAMINOPHEN 500 MG PO TABS
1000.0000 mg | ORAL_TABLET | Freq: Four times a day (QID) | ORAL | Status: DC | PRN
  Administered 2019-09-17: 08:00:00 1000 mg via ORAL
  Filled 2019-09-17: qty 2

## 2019-09-17 MED ORDER — OXYTOCIN 10 UNIT/ML IJ SOLN
INTRAMUSCULAR | Status: AC
  Administered 2019-09-17: 10 [IU]
  Filled 2019-09-17: qty 2

## 2019-09-17 NOTE — OB Triage Note (Signed)
Pt 28 yo, G3P2, [redacted]w[redacted]d presents to unit w/ c/o ctxs every two minutes, rates them as a 5/10, denies vaginal bleeding and decreased fetal movement. Pt reports she woke up with big gush of fluid around 4 am, ctxs started shortly after. Pt reports last intercourse was a couple of hours ago. Monitors applied and assessing, vitals stable.

## 2019-09-17 NOTE — Discharge Summary (Signed)
Obstetrical Discharge Summary  Patient Name: Samantha Levy DOB: [birth year] MRN: [id number]  Date of Admission: 09/17/2019 Date of Delivery: 09/17/2019 Delivered by: Larey Days, MD Date of Discharge: 09/18/2019  Primary OB: [facility]   JIR:CVELFYB'O last menstrual period was 12/17/2018. EDC Estimated Date of Delivery: 09/23/19 Gestational Age at Delivery: [redacted]w[redacted]d   Antepartum complications:  1. History of preterm delivery x 2, on Makena 2. Low-lying placenta resolved @ 33 weeks. 3. Smoker 4. On wellbutrin 150mg  daily  Admitting Diagnosis:  Secondary Diagnosis: Patient Active Problem List   Diagnosis Date Noted  . Labor and delivery indication for care or intervention 09/17/2019  . Pregnancy 05/06/2019  . Preterm labor with preterm delivery 02/14/2018  . Tobacco abuse 08/20/2017    Augmentation: none Complications: None Intrapartum complications/course: precipitous delivery Delivery Type: spontaneous vaginal delivery Anesthesia: none Placenta: spontaneous Laceration: none Episiotomy: none Newborn Data: Live born female  Birth Weight:  3110g 6lb13.7oz APGAR: 6, 9  Newborn Delivery   Birth date/time: 09/17/2019 06:26:00 Delivery type: Vaginal, Spontaneous     Postpartum Procedures: none  Post partum course:  Patient had an uncomplicated postpartum course.  By time of discharge on PPD#1, her pain was controlled on oral pain medications; she had appropriate lochia and was ambulating, voiding without difficulty and tolerating regular diet.  She was deemed stable for discharge to home.     Discharge Physical Exam:  BP 109/70 (BP Location: Left Arm)   Pulse 66   Temp 97.9 F (36.6 C) (Oral)   Resp 18   Ht 5\' 1"  (1.549 m)   Wt 69.4 kg   LMP 12/17/2018   SpO2 98%   Breastfeeding Unknown   BMI 28.91 kg/m   General: NAD CV: RRR Pulm: CTABL, nl effort ABD: s/nd/nt, fundus firm and below the umbilicus Lochia: moderate DVT Evaluation: LE non-ttp,  no evidence of DVT on exam.  Hemoglobin  Date Value Ref Range Status  09/18/2019 10.0 (L) 12.0 - 15.0 g/dL Final  01/08/2018 12.0 11.1 - 15.9 g/dL Final   HCT  Date Value Ref Range Status  09/18/2019 30.7 (L) 36.0 - 46.0 % Final   Hematocrit  Date Value Ref Range Status  01/08/2018 36.4 34.0 - 46.6 % Final     Disposition: stable, discharge to home. Baby Feeding: breastmilk Baby Disposition: home with mom  Rh Immune globulin given: n/a  Rubella vaccine given: n/a Flu vaccine given in AP or PP setting: AP  Contraception: undecided LARC  Prenatal Labs:  Blood type/Rh O pos  Antibody screen neg  Rubella Immune  Varicella Immune  RPR NR  HBsAg Neg  HIV NR  GC neg  Chlamydia neg  Genetic screening negative  1 hour GTT 154  3 hour GTT 17,510,258,52  GBS neg      Plan:  Samantha Levy was discharged to home in good condition. Follow-up appointment with Dr. Leonides Schanz in 6 weeks.  Discharge Medications: Allergies as of 09/18/2019   No Known Allergies     Medication List    TAKE these medications   acetaminophen 500 MG tablet Commonly known as: TYLENOL Take 2 tablets (1,000 mg total) by mouth every 6 (six) hours as needed for mild pain or moderate pain.   buPROPion 150 MG 12 hr tablet Commonly known as: Zyban Take 1 tablet (150 mg total) by mouth 2 (two) times daily.   ibuprofen 600 MG tablet Commonly known as: ADVIL Take 1 tablet (600 mg total) by mouth every 6 (six) hours  as needed for mild pain, moderate pain or cramping.   multivitamin-prenatal 27-0.8 MG Tabs tablet Take 1 tablet by mouth daily at 12 noon.       Follow-up Information    Ward, Elenora Fender, MD Follow up in 6 week(s).   Specialty: Obstetrics and Gynecology Contact information: [address] [place] [address] [phone]           Signed: Genia Del, CNM 09/18/2019 8:36 AM

## 2019-09-17 NOTE — H&P (Addendum)
OB History & Physical   History of Present Illness:  Chief Complaint:   HPI:  JEHAN RANGANATHAN is a 28 y.o. [id number] female at [redacted]w[redacted]d dated by Patient's last menstrual period was 12/17/2018. Estimated Date of Delivery: 09/23/19  She presents to L&D with rupture of membranes at 4am and contractions picking up soon after this.  +FM, + CTX, + LOF, no VB  Pregnancy Issues: 1. History of preterm delivery x 2, on Makena 2. Low-lying placenta resolved @ 33 weeks. 3. Smoker  4. On Wellbutrin 150mg  daily  Maternal Medical History:   Past Medical History:  Diagnosis Date  . Medical history non-contributory     Past Surgical History:  Procedure Laterality Date  . NO PAST SURGERIES      No Known Allergies  Prior to Admission medications   Medication Sig Start Date End Date Taking? Authorizing Provider  buPROPion (ZYBAN) 150 MG 12 hr tablet Take 1 tablet (150 mg total) by mouth 2 (two) times daily. 03/28/18  Yes 03/30/18, MD  Prenatal Vit-Fe Fumarate-FA (MULTIVITAMIN-PRENATAL) 27-0.8 MG TABS tablet Take 1 tablet by mouth daily at 12 noon.   Yes [provider]     Prenatal care site: [facility]    Social History: She  reports that she quit smoking about 6 months ago. Her smoking use included cigarettes. She smoked 0.50 packs per day. She has never used smokeless tobacco. She reports that she does not drink alcohol or use drugs.  Family History: no history of cancers, everyone healthy   Review of Systems: A full review of systems was performed and negative except as noted in the HPI.     Physical Exam:  Vital Signs: BP 117/73   Pulse 67   Temp 98.5 F (36.9 C) (Oral)   Resp 20   Ht 5\' 1"  (1.549 m)   Wt 69.4 kg   LMP 12/17/2018   SpO2 99%   Breastfeeding Unknown   BMI 28.91 kg/m  General: no acute distress.  HEENT: normocephalic, atraumatic Heart: regular rate & rhythm.  No murmurs/rubs/gallops Lungs: clear to auscultation bilaterally,  normal respiratory effort Abdomen: soft, gravid, non-tender;  EFW: 6.5lbs Pelvic:   External: Normal external female genitalia  Cervix: 4 / 60 / -1   Extremities: non-tender, symmetric, 1+ edema bilaterally.  DTRs: 2+ Neurologic: Alert & oriented x 3.    Results for orders placed or performed during the hospital encounter of 09/17/19 (from the past 24 hour(s))  CBC     Status: Abnormal   Collection Time: 09/17/19  6:48 AM  Result Value Ref Range   WBC 10.1 4.0 - 10.5 K/uL   RBC 3.74 (L) 3.87 - 5.11 MIL/uL   Hemoglobin 11.3 (L) 12.0 - 15.0 g/dL   HCT 11/17/19 (L) 11/17/19 - 14.9 %   MCV 91.7 80.0 - 100.0 fL   MCH 30.2 26.0 - 34.0 pg   MCHC 32.9 30.0 - 36.0 g/dL   RDW 70.2 63.7 - 85.8 %   Platelets 191 150 - 400 K/uL   nRBC 0.0 0.0 - 0.2 %  Type and screen [facility]     Status: None (Preliminary result)   Collection Time: 09/17/19  6:48 AM  Result Value Ref Range   ABO/RH(D) PENDING    Antibody Screen PENDING    Sample Expiration      09/20/2019,2359 Performed at [facility], [address], [place], [address] [place]     Pertinent Results:  Prenatal  Labs: Blood type/Rh O pos  Antibody screen neg  Rubella Immune  Varicella Immune  RPR NR  HBsAg Neg  HIV NR  GC neg  Chlamydia neg  Genetic screening negative  1 hour GTT 154  3 hour GTT [phone]  GBS neg   FHT: 125 mod + accels +variables TOCO: [id number]  Cephalic  Assessment:  KEMIAH BOOZ is a 28 y.o. [phone] female at [redacted]w[redacted]d with SROM.   Plan:  1. Admit to Labor & Delivery 2. CBC, T&S, Clrs, COVID 3. GBS  neg 4. Consents obtained. 5. Continuous efm/toco 6. Category 2 tracing  ----- Larey Days, MD Attending Obstetrician and Gynecologist [facility], Department of [facility]

## 2019-09-18 LAB — CBC
HCT: 30.7 % — ABNORMAL LOW (ref 36.0–46.0)
Hemoglobin: 10 g/dL — ABNORMAL LOW (ref 12.0–15.0)
MCH: 30.2 pg (ref 26.0–34.0)
MCHC: 32.6 g/dL (ref 30.0–36.0)
MCV: 92.7 fL (ref 80.0–100.0)
Platelets: 189 10*3/uL (ref 150–400)
RBC: 3.31 MIL/uL — ABNORMAL LOW (ref 3.87–5.11)
RDW: 13.9 % (ref 11.5–15.5)
WBC: 12.1 10*3/uL — ABNORMAL HIGH (ref 4.0–10.5)
nRBC: 0 % (ref 0.0–0.2)

## 2019-09-18 MED ORDER — IBUPROFEN 600 MG PO TABS: 600.0000 mg | ORAL_TABLET | Freq: Four times a day (QID) | ORAL | 0 refills | Status: DC | PRN

## 2019-09-18 MED ORDER — ACETAMINOPHEN 500 MG PO TABS: 1000.0000 mg | ORAL_TABLET | Freq: Four times a day (QID) | ORAL | 0 refills | Status: DC | PRN

## 2019-09-18 NOTE — Discharge Instructions (Signed)

## 2019-09-18 NOTE — Progress Notes (Signed)
Patient discharged home with infant. Discharge instructions, prescriptions and follow up appointment given to and reviewed with patient. Patient verbalized understanding. Pt wheeled out with infant by NT 

## 2019-12-12 NOTE — L&D Delivery Note (Signed)
At 1945, 29yo [id number] at 23+3 wks with IUGR, delivered of normally formed anatomically grossly wnl female fetus after induction with misoprostol for intrauterine fetal demise prior to viability. Fetus delivered en caul.  Placenta followed promptly, with minimal blood loss. FOB cut the cord and baby passed to maternal arms.  Gross anatomy appeared wnl. Feet and hands intact with normal digits. Patent anus, normal appearing female genitalia. Spine intact. Facial features wnl. No low set ears or signs of chromosomal abnormalities.   Piece of the placenta at the insertion of the umbilical cord sent to [facility] for POC microarray.  Planned burial of fetal remains.

## 2020-08-13 LAB — OB RESULTS CONSOLE RUBELLA ANTIBODY, IGM: Rubella: IMMUNE

## 2020-08-13 LAB — OB RESULTS CONSOLE HEPATITIS B SURFACE ANTIGEN: Hepatitis B Surface Ag: NEGATIVE

## 2020-08-13 LAB — OB RESULTS CONSOLE HIV ANTIBODY (ROUTINE TESTING): HIV: NONREACTIVE

## 2020-08-13 LAB — OB RESULTS CONSOLE VARICELLA ZOSTER ANTIBODY, IGG: Varicella: IMMUNE

## 2020-08-13 LAB — OB RESULTS CONSOLE RPR: RPR: NONREACTIVE

## 2020-09-27 ENCOUNTER — Other Ambulatory Visit: Payer: Medicaid Other

## 2020-09-27 ENCOUNTER — Other Ambulatory Visit: Payer: Self-pay | Admitting: Obstetrics & Gynecology

## 2020-09-27 DIAGNOSIS — O99332 Smoking (tobacco) complicating pregnancy, second trimester: Secondary | ICD-10-CM

## 2020-09-27 DIAGNOSIS — O09892 Supervision of other high risk pregnancies, second trimester: Secondary | ICD-10-CM

## 2020-09-27 DIAGNOSIS — F32A Depression, unspecified: Secondary | ICD-10-CM

## 2020-09-27 DIAGNOSIS — O99342 Other mental disorders complicating pregnancy, second trimester: Secondary | ICD-10-CM

## 2020-10-04 ENCOUNTER — Other Ambulatory Visit: Payer: Self-pay | Admitting: Obstetrics & Gynecology

## 2020-10-04 ENCOUNTER — Ambulatory Visit (HOSPITAL_BASED_OUTPATIENT_CLINIC_OR_DEPARTMENT_OTHER): Payer: 59

## 2020-10-04 ENCOUNTER — Other Ambulatory Visit
Admission: RE | Admit: 2020-10-04 | Discharge: 2020-10-04 | Disposition: A | Discharge status: Home / Self Care | Payer: 59 | Source: Ambulatory Visit | Attending: Obstetrics and Gynecology | Admitting: Obstetrics and Gynecology

## 2020-10-04 ENCOUNTER — Encounter: Payer: Self-pay | Admitting: Obstetrics and Gynecology

## 2020-10-04 ENCOUNTER — Other Ambulatory Visit: Payer: Self-pay

## 2020-10-04 ENCOUNTER — Ambulatory Visit: Payer: 59

## 2020-10-04 ENCOUNTER — Ambulatory Visit (HOSPITAL_BASED_OUTPATIENT_CLINIC_OR_DEPARTMENT_OTHER): Payer: 59 | Admitting: Obstetrics and Gynecology

## 2020-10-04 DIAGNOSIS — O99342 Other mental disorders complicating pregnancy, second trimester: Secondary | ICD-10-CM

## 2020-10-04 DIAGNOSIS — O285 Abnormal chromosomal and genetic finding on antenatal screening of mother: Secondary | ICD-10-CM

## 2020-10-04 DIAGNOSIS — O365920 Maternal care for other known or suspected poor fetal growth, second trimester, not applicable or unspecified: Secondary | ICD-10-CM

## 2020-10-04 DIAGNOSIS — O09292 Supervision of pregnancy with other poor reproductive or obstetric history, second trimester: Secondary | ICD-10-CM | POA: Diagnosis not present

## 2020-10-04 DIAGNOSIS — F17200 Nicotine dependence, unspecified, uncomplicated: Secondary | ICD-10-CM | POA: Insufficient documentation

## 2020-10-04 DIAGNOSIS — O09212 Supervision of pregnancy with history of pre-term labor, second trimester: Secondary | ICD-10-CM | POA: Insufficient documentation

## 2020-10-04 DIAGNOSIS — O358XX0 Maternal care for other (suspected) fetal abnormality and damage, not applicable or unspecified: Secondary | ICD-10-CM | POA: Diagnosis not present

## 2020-10-04 DIAGNOSIS — O99332 Smoking (tobacco) complicating pregnancy, second trimester: Secondary | ICD-10-CM | POA: Insufficient documentation

## 2020-10-04 DIAGNOSIS — Z361 Encounter for antenatal screening for raised alphafetoprotein level: Secondary | ICD-10-CM | POA: Insufficient documentation

## 2020-10-04 DIAGNOSIS — Z3A19 19 weeks gestation of pregnancy: Secondary | ICD-10-CM | POA: Insufficient documentation

## 2020-10-04 DIAGNOSIS — R772 Abnormality of alphafetoprotein: Secondary | ICD-10-CM | POA: Diagnosis not present

## 2020-10-04 DIAGNOSIS — O365921 Maternal care for other known or suspected poor fetal growth, second trimester, fetus 1: Secondary | ICD-10-CM

## 2020-10-04 DIAGNOSIS — F32A Depression, unspecified: Secondary | ICD-10-CM

## 2020-10-04 DIAGNOSIS — Z3A00 Weeks of gestation of pregnancy not specified: Secondary | ICD-10-CM | POA: Insufficient documentation

## 2020-10-04 DIAGNOSIS — IMO0002 Reserved for concepts with insufficient information to code with codable children: Secondary | ICD-10-CM

## 2020-10-04 DIAGNOSIS — O09892 Supervision of other high risk pregnancies, second trimester: Secondary | ICD-10-CM

## 2020-10-04 NOTE — Progress Notes (Signed)
Maternal-Fetal Medicine   Name: Brexley Cutshaw DOB: [birth year] MRN: [id number] Referring Provider: Larey Days, MD  Ms. Reisen, G4 P3 at 19w 1d gestation, is here for fetal anatomy scan. On serum screening, increased risk for open spina bifida was seen (1 in 178). MSAFP was 2.76 MoM.  On cell-free fetal DNA screening, the risks of feral aneuploidies are not increased.   Patient had ultrasound at 6 weeks and at 11 weeks' gestations. EDD by ultrasound was reported as 03/04/21. Patient seemed sure of her LMP date when she initiated her prenatal care and her EDD was assigned at 02/27/21 (5 days difference from 6-week ultrasound) based on sure LMP date.  Patient reports no chronic medical conditions.  Obstetric history is significant for term/preterm/term vaginal deliveries. All her children are in good health. Birthweights of term delivery infants were 2,810 g and 3,110 g.  Patient met with our genetic counselor before and after ultrasound. You will be receiving a separate letter from her.  We performed fetal anatomical survey. Amniotic fluid is normal and good fetal activity is seen. Fetal spine, intracranial structures and abdominal wall appear normal. No obvious fetal structural defects are seen. Hyperechogenic bowel is seen. The estimated fetal weight is at the 2nd percentile (fetal biometry lags gestational age by 11 days). No evidence of periventricular or liver or abdominal calcifications.  Umbilical artery Doppler study, performed because of suspected fetal growth restriction, showed intermittent absent-end-diastolic flow.  I counseled the patient on the following: Early-onset fetal growth restriction -We discussed EDD that was assigned (appropriately) based on her sure LMP date. If her LMP is unsure, EDD should be 03/04/21 (EFW will be at the 12th percentile).  -Hyperechogenic bowel can be seen in growth-restricted fetuses. It is unlikely to be a marker for Down syndrome given  that she had low risk for fetal Down syndrome on cell-free fetal DNA screening.  -Cystic fibrosis screening was discussed by our genetic counselor and the patient would like to wait before opting for screening.  -Possible causes of fetal growth restriction include fetal chromosomal anomalies or genetic syndromes. Other causes include placental insufficiency and fetal infections. Placental insufficiency is the most-likely cause. In women who develop preeclampsia later in pregnancy, growth restriction may manifest earlier.  -Amniocentesis will be able to confirm chromosomal anomalies or some (not all) genetic syndromes. I discussed the procedure and its possible complications including miscarriage (1 in 500 procedures).  -Explained viability (23 to 24 weeks).   After counseling, the patient opted not to have amniocentesis. I recommended screening for CMV and toxo today. Patient opted for screening.  I discussed follow-up ultrasound in 3 weeks for fetal growth and umbilical artery Doppler studies. Based on the findings, we will counsel the patient.  Increased risk for open-neural tube defects and increased AFP:  We reassured the patient of normal fetal anatomy on ultrasound including spine and intracranial structures. I informed her that ultrasound can detect up to 95 out of 100 cases of spina bifida and that amniocentesis will only marginally improve the detection rate. Increased AFP can also be associated with fetal growth restriction (placental insufficiency), preterm delivery and stillbirth (very rare). I recommended serial fetal growth assessments. It can also follow vaginal bleeding. Patient had vaginal bleeding early in pregnancy.   Recommendations: -CMV and Toxoplasmosis IgG and IgM (blood was drawn today). -We will communicate the results and fax copies to your office. -Follow-up fetal growth assessment and umbilical artery Doppler in 3 weeks. -We will recommend fetal echocardiography if  fetal growth restriction persists at her next visit.  Consultation including face-to-face counseling: 45 minutes.

## 2020-10-04 NOTE — Progress Notes (Signed)
Referring Provider:  [facility], Heloise Ochoa, CNM Length of Consultation:30 minutes  Ms. Brecht was referred to Maternal Fetal Care at [facility] for an ultrasound and genetic counseling due to an increased risk for an open neural tube defect (ONTD) on MS-AFP screening. Results of the screen indicated that the current pregnancy has a 1 in 178 (0.56%) risk to be affected with an ONTD such as spina bifida, as the level of AFP measured 2.76 MoM (which is above the cutoff of 2.5 MoM.   We reviewed these MS-AFP results in detail. We discussed that there are many explanations for an elevated AFP result, including twin pregnancies, dating errors, ONTDs such as anencephaly or spina bifida, abdominal wall defects, placental abnormalities, fetal growth restriction, and adverse obstetrical outcomes (oligohydramnios, placental abruption, intrauterine fetal demise/stillbirth, preterm delivery, premature rupture of membranes, or preeclampsia), or a normal variant. There also have been reports of an association between first trimester bleeding and first trimester vaginal bleeding, which our patient did report.  The patient had an ultrasound at this visit which revealed no structural anomalies suggestive of an open neural tube defect or abdominal wall defect.  There was growth restriction, abnormal blood flow in the umbilical cord and echogenic bowel.  See ultrasound report and MFM consultation notes for details. We discussed the possible causes for echogenic bowel, including normal variation, fetal blood ingestion, fetal aneuploidy, cystic fibrosis, growth restriction, infection, and a fetal gastrointestinal malformation or obstruction. Most babies with echogenic bowel identified on ultrasound are normal. We also reviewed that there may be many reasons for growth restriction, including aneuploidy, placental abnormalities, infections and other causes.   We discussed available testing options to attempt to  determine the etiology of the ultrasound findings The patient already had noninvasive prenatal screening (NIPS) for chromosomal aneuploidies which was negative. To review, NIPS analyzes cell free DNA originating from the placenta that is found in the maternal blood circulation during pregnancy. This test is not diagnostic for chromosome conditions, but can provide information regarding the presence or absence of extra fetal DNA for chromosomes 13, 18, 21, and the sex chromosomes. Thus, it would not identify or rule out all fetal aneuploidy. The reported detection rate is 91-99% for trisomies 21, 18, 13, and sex chromosome aneuploidies. The false positive rate is reported to be less than 0.1% for any of these conditions. The normal results on this testing can greatly reduce, but not eliminate, the chance for these chromosome conditions.  It cannot assess the chance for other, less common, chromosome conditions.   Secondly, we discussed that carrier screening for cystic fibrosis is available. Cystic fibrosis (CF) is an inherited disease characterized by the buildup of thick, sticky mucus that can damage many of the body's organs. The most common signs and symptoms of CF include progressive damage to the respiratory system and chronic digestive system problems. Some fetuses with CF demonstrate echogenic bowel on prenatal ultrasound. Features of CF and their severity varies among affected individuals. CF is inherited in an autosomal recessive pattern, where both parents must be carriers of the condition to have a 1 in 4 (25%) risk of having an affected child. We discussed that if Ms. Tapp were to be identified as a carrier for CF, carrier screening would then be recommended for her partner to refine the risks for CF in the current pregnancy.    Thirdly, testing for infections via blood is available. Maternal infections contracted during pregnancy can sometimes be passed to the fetus. If a  fetus becomes  infected, the fetus is at risk for developing problems, such as echogenic bowel. We discussed the option of performing a blood test for common infections like cytomegalovirus and toxoplasmosis, both of which can be associated with echogenic bowel. If it is determined that Ms. Dascenzo has had a recent infection, amniotic fluid obtained from an amniocentesis can also be tested to learn if a maternal infection has reached the fetus.   Finally, prenatal diagnostic testing for fetal aneuploidy, cystic fibrosis, and prenatal infections is available via amniocentesis. We discussed the technical aspects of the procedure and quoted up to a 1 in 500 (0.2%) risk for spontaneous pregnancy loss or other adverse pregnancy outcomes as a result of amniocentesis. Cultured cells from an amniocentesis sample allow for the visualization of a fetal karyotype, which can detect >99% of chromosomal aberrations. Chromosomal microarray can also be performed to identify smaller deletions or duplications of fetal chromosomal material. Amniocentesis could also be performed to assess whether the baby is affected by CF or prenatal infections such as CMV or toxoplasmosis. After careful consideration, Ms. Homesley declined amniocentesis at this time. She understands that amniocentesis is available at any point after 16 weeks of pregnancy and that she may opt to undergo the procedure at a later date should she change her mind. She also understands that we will continue to follow her via ultrasound to assess the echogenic bowel and fetal growth.  A repeat MSAFP could also be considered given the mildly elevated level on the first sample.  Given the lack of evidence for an open neural tube defect on this ultrasound and the fact that we will be following this pregnancy by ultrasound in 3 weeks, the patient declined a repeat of this testing.  It would likely not chance our management of the pregnancy.   Ms. Anderegg had her blood drawn for CF  carrier screening, CMV IgG and IgM, and toxoplasmosis IgG and IgM today. Results from testing will likely become available within 2 weeks. I will call Ms. Hosek when results become available.   We also inquired about the pregnancy history and family history.  This is the fourth pregnancy for this couple.  They have three health children, ages 53, 2 and 1 years. The eldest and youngest were both 6 pounds at birth and the middle child was born at [redacted] weeks gestation weighing 3 pounds, per patient report.  There are no reported family members with birth defects, developmental delays, recurrent pregnancy loss, stillbirth or known genetic conditions.  In this pregnancy, she reports spotting in August, but none in the last two months.  Her periods are reported to have been very irregular and she was placed on OCPs prior to learning that she was pregnant.  She took those for 2-3 weeks very early in pregnancy and is prescribed Zoloft as well. Neither of these exposures is known to be associated with an increased risk for growth restriction.  Some studies have inconsistently reported associations of Zoloft use during pregnancy with various birth defects and with neonatal pulmonary hypertension. In addition, the use of serotonin re-uptake inhibitors late in pregnancy has been associated with a mild transient neonatal syndrome of central nervous system, motor, respiratory, and gastrointestinal signs.  Ms. Ovens reported no other complications and no exposure to alcohol or recreational drugs in this pregnancy. She has been smoking 3-5 cigarettes per day.  Plans:  Return to Maternal Fetal Care at [facility] in 3 weeks for growth/anatomy ultrasound.  CF carrier screening drawn  today  Testing for Toxoplasmosis (IgG and IgM) as well as CMV (IgG and IgM) drawn today  We appreciated being involved in the care of this patient and can be reached at [phone].  Cherly Anderson, MS, CGC

## 2020-10-05 LAB — TOXOPLASMA GONDII ANTIBODY, IGM: Toxoplasma Antibody- IgM: <3 AU/mL (ref 0.0–7.9)

## 2020-10-05 LAB — CMV IGM: CMV IgM: <30 AU/mL (ref 0.0–29.9)

## 2020-10-05 LAB — CMV ANTIBODY, IGG (EIA): CMV Ab - IgG: <0.6 U/mL (ref 0.00–0.59)

## 2020-10-05 LAB — INFECT DISEASE AB IGM REFLEX 1

## 2020-10-05 LAB — TOXOPLASMA GONDII ANTIBODY, IGG: Toxoplasma IgG Ratio: <3 IU/mL (ref 0.0–7.1)

## 2020-10-07 ENCOUNTER — Telehealth: Payer: Self-pay | Admitting: Obstetrics and Gynecology

## 2020-10-07 NOTE — Telephone Encounter (Signed)
I left a message for Samantha Levy to call our clinic regarding the results of the testing from earlier this week. We have not yet heard back from her.  The results of the IgG and IgM testing for both CMV and Toxoplasmosis are negative, indicating no past or recent infection.  Therefore, we would not expect the growth restriction in this pregnancy to be related to these infectious causes.  We can be reached at [phone].

## 2020-10-12 LAB — CYSTIC FIBROSIS GENE TEST

## 2020-10-14 ENCOUNTER — Telehealth: Payer: Self-pay | Admitting: Obstetrics and Gynecology

## 2020-10-14 NOTE — Telephone Encounter (Signed)
  We are pleased to inform you that the results of the recent screening test for Cystic fibrosis (CF) are now available.    CF is a genetic condition that occurs most often in Caucasian persons.  It primarily affects the lungs, digestive, and reproductive systems.  For someone to be at risk for having CF, both of their parents must be carriers for CF.  The testing can detect many persons who are carriers for CF and therefore determine if the pregnancy is at an increased risk for this condition.  This screening was offered for you because of the finding of echogenic bowel on ultrasound.  The blood test results were negative when examined for the 32 most common mutations (or changes) in the gene for CF.  This means that she does not carry any of the most common changes in this gene.  Testing for these 32 mutations detects approximately 90% of carriers who are Caucasian.  Therefore, the chance that she is a carrier based on this negative result has been reduced from 1 in 25 to approximately 1 in 240.  Because this testing cannot detect all changes that may cause CF, we cannot eliminate the chance that this individual is a carrier completely.  If there are any questions or concerns, please feel free to contact our office at [phone].      Samantha Anderson, MS, CGC

## 2020-10-21 ENCOUNTER — Other Ambulatory Visit: Payer: Self-pay | Admitting: Obstetrics & Gynecology

## 2020-10-21 DIAGNOSIS — Z720 Tobacco use: Secondary | ICD-10-CM

## 2020-10-21 DIAGNOSIS — O365990 Maternal care for other known or suspected poor fetal growth, unspecified trimester, not applicable or unspecified: Secondary | ICD-10-CM

## 2020-10-25 ENCOUNTER — Ambulatory Visit: Payer: 59 | Attending: Obstetrics and Gynecology

## 2020-10-25 ENCOUNTER — Other Ambulatory Visit: Payer: Self-pay | Admitting: Obstetrics & Gynecology

## 2020-10-25 ENCOUNTER — Telehealth: Payer: Self-pay

## 2020-10-25 ENCOUNTER — Other Ambulatory Visit: Payer: Self-pay

## 2020-10-25 DIAGNOSIS — Z720 Tobacco use: Secondary | ICD-10-CM

## 2020-10-25 DIAGNOSIS — O365922 Maternal care for other known or suspected poor fetal growth, second trimester, fetus 2: Secondary | ICD-10-CM | POA: Diagnosis not present

## 2020-10-25 DIAGNOSIS — O365921 Maternal care for other known or suspected poor fetal growth, second trimester, fetus 1: Secondary | ICD-10-CM | POA: Insufficient documentation

## 2020-10-25 DIAGNOSIS — Z361 Encounter for antenatal screening for raised alphafetoprotein level: Secondary | ICD-10-CM | POA: Insufficient documentation

## 2020-10-25 DIAGNOSIS — O365990 Maternal care for other known or suspected poor fetal growth, unspecified trimester, not applicable or unspecified: Secondary | ICD-10-CM

## 2020-10-25 DIAGNOSIS — Z3A28 28 weeks gestation of pregnancy: Secondary | ICD-10-CM | POA: Insufficient documentation

## 2020-11-02 ENCOUNTER — Other Ambulatory Visit: Payer: Self-pay | Admitting: Obstetrics & Gynecology

## 2020-11-02 DIAGNOSIS — O365990 Maternal care for other known or suspected poor fetal growth, unspecified trimester, not applicable or unspecified: Secondary | ICD-10-CM

## 2020-11-02 DIAGNOSIS — Z720 Tobacco use: Secondary | ICD-10-CM

## 2020-11-08 ENCOUNTER — Other Ambulatory Visit: Payer: Self-pay

## 2020-11-08 ENCOUNTER — Ambulatory Visit: Payer: 59 | Attending: Obstetrics and Gynecology

## 2020-11-08 ENCOUNTER — Other Ambulatory Visit: Payer: Self-pay | Admitting: Obstetrics & Gynecology

## 2020-11-08 DIAGNOSIS — F17200 Nicotine dependence, unspecified, uncomplicated: Secondary | ICD-10-CM | POA: Diagnosis not present

## 2020-11-08 DIAGNOSIS — Z720 Tobacco use: Secondary | ICD-10-CM

## 2020-11-08 DIAGNOSIS — O365920 Maternal care for other known or suspected poor fetal growth, second trimester, not applicable or unspecified: Secondary | ICD-10-CM

## 2020-11-08 DIAGNOSIS — O365990 Maternal care for other known or suspected poor fetal growth, unspecified trimester, not applicable or unspecified: Secondary | ICD-10-CM

## 2020-11-08 DIAGNOSIS — O99332 Smoking (tobacco) complicating pregnancy, second trimester: Secondary | ICD-10-CM | POA: Diagnosis not present

## 2020-11-08 DIAGNOSIS — Z3A23 23 weeks gestation of pregnancy: Secondary | ICD-10-CM | POA: Insufficient documentation

## 2020-11-08 DIAGNOSIS — O4102X0 Oligohydramnios, second trimester, not applicable or unspecified: Secondary | ICD-10-CM | POA: Diagnosis not present

## 2020-11-08 NOTE — Progress Notes (Unsigned)
Pt had routine [place] @ [facility] this morning.  Fetal Demise on [place].  Dr. Dalbert Garnet paged and spoke with Dr. Judeth Cornfield about POC on the phone.  Dr. Dalbert Garnet also came to the clinic to talk with pt and MFM prior to pt leaving the clinic.

## 2020-11-08 NOTE — Progress Notes (Signed)
OB ADMISSION/ HISTORY & PHYSICAL:  Admission Date: No admission date for patient encounter.  Admit Diagnosis: Fetal demise at 23 weeks  Samantha Levy is a 29 y.o. [phone] presenting for induction of labor with an IUFD at 23+3wks from likely placental insufficiency.  Prenatal History: [id number]   EDC : 03/04/2021, by 6 week ultrasound inconsistent with dates Prenatal care at [facility] Prenatal course complicated by  - FGR, with normal fetal echo, + hyperechogenic bowel, with reversed end diastolic flow on UAD. No gross anatomic abnormalities.  - CMV, toxo and CF screening neg  - Elevated AFP 1:178, MoM below 3  - Hx of tobacco use in pregnancy  - has seen Dentist. 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 AFP screen positive. Declined amnio  - see ultrasound findings below  - depression/anxiety, on zoloft 100mg  as of 10/1. ED 22 -hx of forceps assisted birth with G1 - hx of subchorionic hemorrhage this pregnancy, last 3x2cm on right 09/24/20  FOB 09/26/20  Medical / Surgical History :  Past medical history:  Past Medical History:  Diagnosis Date   Medical history non-contributory      Past surgical history:  Past Surgical History:  Procedure Laterality Date   NO PAST SURGERIES      Family History:  Family History  Problem Relation Age of Onset   Diabetes type II Mother      Social History:  reports that she quit smoking about 19 months ago. Her smoking use included cigarettes. She smoked 0.50 packs per day. She has never used smokeless tobacco. She reports that she does not drink alcohol and does not use drugs.   Allergies: Patient has no known allergies.    Current Medications at time of admission:  Prior to Admission medications   Medication Sig Start Date End Date Taking? Authorizing Provider  acetaminophen (TYLENOL) 500 MG tablet Take 2 tablets (1,000 mg total) by mouth every 6 (six) hours as needed for mild pain or moderate pain. 09/18/19   11/18/19, CNM   Prenatal Vit-Fe Fumarate-FA (MULTIVITAMIN-PRENATAL) 27-0.8 MG TABS tablet Take 1 tablet by mouth daily at 12 noon.    [provider]  sertraline (ZOLOFT) 100 MG tablet Take 100 mg by mouth daily.    [provider]     Review of Systems: Maternal anxiety/depression/appropriate sadness   Physical Exam:  pending   Cephalic by ultrasound  Prenatal Labs: Blood type/Rh    Antibody screen neg  Rubella Immune  Varicella Immune  RPR NR  HBsAg Neg  HIV NR  GC neg  Chlamydia neg  Genetic screening Negative except for AFP elevation  1 hour GTT n/a  3 hour GTT n/a  GBS n/a   Genia Del MFM OB FOLLOW UP  Result Date: 10/25/2020 ----------------------------------------------------------------------  OBSTETRICS REPORT                       (Signed Final 10/25/2020 12:38 pm) ---------------------------------------------------------------------- Patient Info  ID #:       10/27/2020                          D.O.B.:  1991-05-16 (29 yrs)  Name:       Samantha Levy            Visit Date: 10/25/2020 09:26 am ---------------------------------------------------------------------- Performed By  Attending:        10/27/2020 MD        Ref. Address:  [address] [place],                                                             [place] [address]  Performed By:     Criselda Peaches         [facility]                                     F[facility] Care at                                                             [facility]  Referred By:      Leola Brazil MD ---------------------------------------------------------------------- Orders  #  Description                           Code        Ordered By  1  [place] MFM OB FOLLOW UP                   76816.01    CHELSEA WARD  2  [place] MFM UA CORD DOPPLER                76820.02    CHELSEA WARD  ----------------------------------------------------------------------  #  Order #                     Accession #                Episode #  1  [id number]                   [id number]                 [id number]  2  [id number]                   [id number]                 [id number] ---------------------------------------------------------------------- Indications  [redacted] weeks gestation of pregnancy                Z3A.21  Encounter for raised alfafetoprotein level     Z36.1  Tobacco use complicating pregnancy             O99.330  Poor obstetric history: Previous preterm  O09.219  delivery, antepartum  Maternal care for known or suspected poor      O36.5921  fetal growth, second trimester, fetus 1 IUGR ---------------------------------------------------------------------- Fetal Evaluation  Num Of Fetuses:         1  Fetal Heart Rate(bpm):  153  Cardiac Activity:       Observed  Presentation:           Cephalic  Placenta:               Right lateral                              Largest Pocket(cm)                              2.2 ---------------------------------------------------------------------- Biometry  BPD:      44.2  mm     G. Age:  19w 3d        1.1  %    CI:        67.57   %    70 - 86                                                          FL/HC:      17.6   %    15.9 - 20.3  HC:      172.1  mm     G. Age:  19w 5d        1.3  %    HC/AC:      1.13        1.06 - 1.25  AC:      152.7  mm     G. Age:  20w 3d         16  %    FL/BPD:     68.6   %  FL:       30.3  mm     G. Age:  19w 3d        1.5  %    FL/AC:      19.8   %    20 - 24  HUM:      28.7  mm     G. Age:  19w 2d        < 5  %  Est. FW:     321  gm    0 lb 11 oz     2.2  % ---------------------------------------------------------------------- Gestational Age  LMP:           22w 1d        Date:  05/23/20                 EDD:   02/27/21  U/S Today:     19w 5d                                        EDD:   03/16/21  Best:          [place] 3d     Det. By:  Marcella Dubs         EDD:  03/04/21                                      (07/12/20) ---------------------------------------------------------------------- Anatomy  Cranium:               Previously seen        Aortic Arch:            Previously seen  Cavum:                 Appears normal         Ductal Arch:            Previously seen  Ventricles:            Appears normal         Diaphragm:              Appears normal  Choroid Plexus:        Previously seen        Stomach:                Appears normal, left                                                                        sided  Cerebellum:            Previously seen        Abdomen:                Echogenic Bowel  Posterior Fossa:       Previously seen        Abdominal Wall:         Previously seen  Nuchal Fold:           Previously seen        Cord Vessels:           Previously seen  Face:                  Orbits and profile     Kidneys:                Appear normal                         previously seen  Lips:                  Previously seen        Bladder:                Appears normal  Thoracic:              Appears normal         Spine:                  Appears normal  Heart:                 Appears normal         Upper Extremities:      Previously seen                         (  4CH, axis, and                         situs)  RVOT:                  Previously seen        Lower Extremities:      Previously seen  LVOT:                  Previously seen ---------------------------------------------------------------------- Doppler - Fetal Vessels  Umbilical Artery   S/D     %tile      RI    %tile      PI    %tile            ADFV    RDFV   9.54   > 97.5     0.9   > 97.5    1.67   > 97.5              Yes      No ---------------------------------------------------------------------- Cervix Uterus Adnexa  Cervix  Length:           4.29  cm. ---------------------------------------------------------------------- Impression  Early-onset fetal growth restriction.  Patient returned for fetal  growth assessment and umbilical artery Doppler studies.  Following her previous visit, after my communication with Dr.  Elesa Massed (EPIC message), I had amended her EDD to 03/04/20  and it is established by her early ultrasound. Patient feels her  LMP sure but is unsure of her cycle lengths. She also gives  history of irregular menstrual bleeding.  Early ultrasound dating is very accurate, and I recommend  we establish her EDD based on 6-week ultrasound.  We had informed the patient of negative toxoplasmosis and  CMV screening, and that she is not a carrier of cystic fibrosis  mutation.  She does not have hypertension or any other chronic medical  conditions.  On today's ultrasound, the estimated fetal weight is at the 2nd  percentile. Head circumference measurement is at -1 SD (no  evidence of microcephaly). Amniotic fluid is normal and good  fetal activity is seen. Hyperechogenic bowel is seen again.  No obvious fetal structural defects are seen.  Umbilical artery Doppler showed intermittent absent-end-  diastolic flow.  I explained the findings and counseled her.  -Severe fetal growth restriction is based on the finding of  estimated fetal weight below the 3rd percentile.  -Worsening of umbilical artery Doppler study is seen on  today's ultrasound.  -More-frequent monitoring would be recommended after  viable gestational age (32 to 24 weeks) is reached.  -Discussed the benefit of antenatal corticosteroids that would  be recommended if UA Doppler study showed absent or  reversed-flow.  -Delivery at 24 weeks' gestation is associated with a high  perinatal mortality and morbidity rates. Patient has an option  of expectant management till later gestational age (28 weeks  or later). However, expectant management is associated with  an increased risk of stillbirth.  -We will obtain neonatal consultation after next fetal growth  assessment that would help patient decide on timing of  delivery.  -I  discussed amniocentesis again. Early-onset fetal growth  restriction is associated with fetal chromosomal anomalies.  Patient opted not to have amniocentesis. ---------------------------------------------------------------------- Recommendations  -An appointment was made for her to return in 2 weeks for  umbilical artery Doppler study.  -Fetal growth assessment in 3 weeks and neonatal  consultation. Patient was advised to bring some family  member for neonatal consultation.  -Fetal growth assessment may be considered in 2 weeks if  reversed-end-diastolic flow is seen on umbilical artery  Doppler.  -We have requested an appointment for fetal  echocardiography. ----------------------------------------------------------------------                  Noralee Space, MD Electronically Signed Final Report   10/25/2020 12:38 pm ----------------------------------------------------------------------  [place] MFM OB LIMITED  Result Date: 11/08/2020 ----------------------------------------------------------------------  OBSTETRICS REPORT                       (Signed Final 11/08/2020 11:42 am) ---------------------------------------------------------------------- Patient Info  ID #:       [id number]                          D.O.B.:  1991/07/08 (29 yrs)  Name:       Samantha Levy            Visit Date: 11/08/2020 10:09 am ---------------------------------------------------------------------- Performed By  Attending:        Noralee Space MD        Ref. Address:     [address]                                                             [place], [place],                                                             [place] [address]  Performed By:     Loretha Brasil       [facility]                                                             [facility] at                                                             [facility]  Referred By:      Leola Brazil MD  ---------------------------------------------------------------------- Orders  #  Description                           Code        Ordered By  1  [place] MFM OB LIMITED                     01027.25    CHELSEA WARD ----------------------------------------------------------------------  #  Order #  Accession #                Episode #  1  [id number]                   [id number]                 [id number] ---------------------------------------------------------------------- Indications  [redacted] weeks gestation of pregnancy                Z3A.23  Tobacco use complicating pregnancy,            O99.332  second trimester  Maternal care for known or suspected poor      O36.5920  fetal growth, second trimester, not applicable  or unspecified IUGR ---------------------------------------------------------------------- Fetal Evaluation  Num Of Fetuses:         1  Cardiac Activity:       Not visualized                              Largest Pocket(cm)                              1.3 ---------------------------------------------------------------------- Gestational Age  LMP:           24w 1d        Date:  05/23/20                 EDD:   02/27/21  Best:          23w 3d     Det. By:  Marcella DubsEarly Ultrasound         EDD:   03/04/21                                      (07/12/20) ---------------------------------------------------------------------- Cervix Uterus Adnexa  Cervix  Length:           3.01  cm. ---------------------------------------------------------------------- Impression  Severe fetal growth restriction.  Patient returned for umbilical artery Doppler study. [facility]Dr. Mayer Camelatum). No structural heart defects were seen.  Mild pericardial effusion was seen. Umbilical artery Doppler  showed reversed-end-diastolic flow.  On today's ultrasound, oligohydramnios is seen (MVP 1 cm).  Cephalic presentation. Unfortunately, no fetal heart activity is  seen.   Impression: Fetal demise.  I counseled the patient (and later her husband) on the  findings. Most-likely cause is placental insufficiency. Fetal  chromosomal anomaly is also likely. Postnatal microarray  would be helpful.  Dr. Dalbert GarnetBeasley came to our office and counseled the patient.  She will be scheduled for induction of labor (misoprostol) by  Dr. Francena HanlyBeasley's team. ----------------------------------------------------------------------                  Noralee Spaceavi Shankar, MD Electronically Signed Final Report   11/08/2020 11:42 am ----------------------------------------------------------------------  US MFM UA CORD DOPPLER  Result Date: 10/25/2020 ----------------------------------------------------------------------  OBSTETRICS REPORT                       (Signed Final 10/25/2020 12:38 pm) ---------------------------------------------------------------------- Patient Info  ID #:       [id number]                          D.O.B.:  [birth year] (29 yrs)  Name:       Samantha Levy            Visit Date: 10/25/2020 09:26 am ---------------------------------------------------------------------- Performed By  Attending:        Noralee Space MD        Ref. Address:     [address]                                                             [place], [place],                                                             [place] [address]  Performed By:     Criselda Peaches         [facility]                                     [facility] at                                                             [facility]  Referred By:      Leola Brazil MD ---------------------------------------------------------------------- Orders  #  Description                           Code        Ordered By  1  [place] MFM OB FOLLOW UP                   76816.01    CHELSEA WARD  2  [place] MFM UA CORD DOPPLER                14782.95    CHELSEA WARD  ----------------------------------------------------------------------  #  Order #                     Accession #                Episode #  1  [id number]                   [id number]                 [id number]  2  [id number]                   [id number]                 [id number] ---------------------------------------------------------------------- Indications  [redacted] weeks  gestation of pregnancy                Z3A.21  Encounter for raised alfafetoprotein level     Z36.1  Tobacco use complicating pregnancy             O99.330  Poor obstetric history: Previous preterm       O09.219  delivery, antepartum  Maternal care for known or suspected poor      O36.5921  fetal growth, second trimester, fetus 1 IUGR ---------------------------------------------------------------------- Fetal Evaluation  Num Of Fetuses:         1  Fetal Heart Rate(bpm):  153  Cardiac Activity:       Observed  Presentation:           Cephalic  Placenta:               Right lateral                              Largest Pocket(cm)                              2.2 ---------------------------------------------------------------------- Biometry  BPD:      44.2  mm     G. Age:  19w 3d        1.1  %    CI:        67.57   %    70 - 86                                                          FL/HC:      17.6   %    15.9 - 20.3  HC:      172.1  mm     G. Age:  19w 5d        1.3  %    HC/AC:      1.13        1.06 - 1.25  AC:      152.7  mm     G. Age:  20w 3d         16  %    FL/BPD:     68.6   %  FL:       30.3  mm     G. Age:  19w 3d        1.5  %    FL/AC:      19.8   %    20 - 24  HUM:      28.7  mm     G. Age:  19w 2d        < 5  %  Est. FW:     321  gm    0 lb 11 oz     2.2  % ---------------------------------------------------------------------- Gestational Age  LMP:           22w 1d        Date:  05/23/20                 EDD:   02/27/21  U/S Today:     19w 5d  EDD:   03/16/21  Best:          [place] 3d     Det. ByMarcella Dubs         EDD:   03/04/21                                      (07/12/20) ---------------------------------------------------------------------- Anatomy  Cranium:               Previously seen        Aortic Arch:            Previously seen  Cavum:                 Appears normal         Ductal Arch:            Previously seen  Ventricles:            Appears normal         Diaphragm:              Appears normal  Choroid Plexus:        Previously seen        Stomach:                Appears normal, left                                                                        sided  Cerebellum:            Previously seen        Abdomen:                Echogenic Bowel  Posterior Fossa:       Previously seen        Abdominal Wall:         Previously seen  Nuchal Fold:           Previously seen        Cord Vessels:           Previously seen  Face:                  Orbits and profile     Kidneys:                Appear normal                         previously seen  Lips:                  Previously seen        Bladder:                Appears normal  Thoracic:              Appears normal         Spine:                  Appears normal  Heart:                 Appears normal  Upper Extremities:      Previously seen                         (4CH, axis, and                         situs)  RVOT:                  Previously seen        Lower Extremities:      Previously seen  LVOT:                  Previously seen ---------------------------------------------------------------------- Doppler - Fetal Vessels  Umbilical Artery   S/D     %tile      RI    %tile      PI    %tile            ADFV    RDFV   9.54   > 97.5     0.9   > 97.5    1.67   > 97.5              Yes      No ---------------------------------------------------------------------- Cervix Uterus Adnexa  Cervix  Length:           4.29  cm. ---------------------------------------------------------------------- Impression  Early-onset fetal growth restriction.  Patient returned for fetal  growth assessment and umbilical artery Doppler studies.  Following her previous visit, after my communication with Dr.  Elesa Massed (EPIC message), I had amended her EDD to 03/04/20  and it is established by her early ultrasound. Patient feels her  LMP sure but is unsure of her cycle lengths. She also gives  history of irregular menstrual bleeding.  Early ultrasound dating is very accurate, and I recommend  we establish her EDD based on 6-week ultrasound.  We had informed the patient of negative toxoplasmosis and  CMV screening, and that she is not a carrier of cystic fibrosis  mutation.  She does not have hypertension or any other chronic medical  conditions.  On today's ultrasound, the estimated fetal weight is at the 2nd  percentile. Head circumference measurement is at -1 SD (no  evidence of microcephaly). Amniotic fluid is normal and good  fetal activity is seen. Hyperechogenic bowel is seen again.  No obvious fetal structural defects are seen.  Umbilical artery Doppler showed intermittent absent-end-  diastolic flow.  I explained the findings and counseled her.  -Severe fetal growth restriction is based on the finding of  estimated fetal weight below the 3rd percentile.  -Worsening of umbilical artery Doppler study is seen on  today's ultrasound.  -More-frequent monitoring would be recommended after  viable gestational age (27 to 24 weeks) is reached.  -Discussed the benefit of antenatal corticosteroids that would  be recommended if UA Doppler study showed absent or  reversed-flow.  -Delivery at 24 weeks' gestation is associated with a high  perinatal mortality and morbidity rates. Patient has an option  of expectant management till later gestational age (28 weeks  or later). However, expectant management is associated with  an increased risk of stillbirth.  -We will obtain neonatal consultation after next fetal growth  assessment that would help patient decide on timing of  delivery.  -I  discussed amniocentesis again. Early-onset fetal growth  restriction is associated with fetal chromosomal anomalies.  Patient opted not to have amniocentesis. ---------------------------------------------------------------------- Recommendations  -An appointment  was made for her to return in 2 weeks for  umbilical artery Doppler study.  -Fetal growth assessment in 3 weeks and neonatal  consultation. Patient was advised to bring some family  member for neonatal consultation.  -Fetal growth assessment may be considered in 2 weeks if  reversed-end-diastolic flow is seen on umbilical artery  Doppler.  -We have requested an appointment for fetal  echocardiography. ----------------------------------------------------------------------                  Noralee Space, MD Electronically Signed Final Report   10/25/2020 12:38 pm ----------------------------------------------------------------------   Assessment: [redacted]w[redacted]d weeks gestation, fetal demise   Plan:  Induction of labor with misoprostol, cephalic presentation confirmed on ultrasound prior to admission. - misoprostol PV q4hrs - epidural on request. Other iv pain meds on request  Plan for [employer] POC chromosome array from placenta, codes [id number] with mat cell conf [id number].  -placenta to pathology

## 2020-11-09 ENCOUNTER — Other Ambulatory Visit: Payer: Self-pay

## 2020-11-09 ENCOUNTER — Encounter: Payer: Self-pay | Admitting: Obstetrics and Gynecology

## 2020-11-09 ENCOUNTER — Other Ambulatory Visit: Payer: Self-pay | Admitting: Obstetrics and Gynecology

## 2020-11-09 ENCOUNTER — Inpatient Hospital Stay
Admission: EM | Admit: 2020-11-09 | Discharge: 2020-11-10 | DRG: 807 | Disposition: A | Discharge status: Home / Self Care | Payer: 59 | Attending: Obstetrics and Gynecology | Admitting: Obstetrics and Gynecology

## 2020-11-09 DIAGNOSIS — Z3A23 23 weeks gestation of pregnancy: Secondary | ICD-10-CM

## 2020-11-09 DIAGNOSIS — O99344 Other mental disorders complicating childbirth: Secondary | ICD-10-CM | POA: Diagnosis present

## 2020-11-09 DIAGNOSIS — Z20822 Contact with and (suspected) exposure to covid-19: Secondary | ICD-10-CM | POA: Diagnosis present

## 2020-11-09 DIAGNOSIS — Z87891 Personal history of nicotine dependence: Secondary | ICD-10-CM | POA: Diagnosis not present

## 2020-11-09 DIAGNOSIS — O365920 Maternal care for other known or suspected poor fetal growth, second trimester, not applicable or unspecified: Secondary | ICD-10-CM | POA: Diagnosis present

## 2020-11-09 DIAGNOSIS — O364XX0 Maternal care for intrauterine death, not applicable or unspecified: Secondary | ICD-10-CM | POA: Diagnosis present

## 2020-11-09 DIAGNOSIS — F32A Depression, unspecified: Secondary | ICD-10-CM | POA: Diagnosis present

## 2020-11-09 DIAGNOSIS — F419 Anxiety disorder, unspecified: Secondary | ICD-10-CM | POA: Diagnosis present

## 2020-11-09 LAB — CBC
HCT: 39.8 % (ref 36.0–46.0)
Hemoglobin: 13.6 g/dL (ref 12.0–15.0)
MCH: 31.9 pg (ref 26.0–34.0)
MCHC: 34.2 g/dL (ref 30.0–36.0)
MCV: 93.4 fL (ref 80.0–100.0)
Platelets: 214 10*3/uL (ref 150–400)
RBC: 4.26 MIL/uL (ref 3.87–5.11)
RDW: 13.5 % (ref 11.5–15.5)
WBC: 10.5 10*3/uL (ref 4.0–10.5)
nRBC: 0 % (ref 0.0–0.2)

## 2020-11-09 LAB — TYPE AND SCREEN
ABO/RH(D): O POS
Antibody Screen: NEGATIVE

## 2020-11-09 LAB — RESP PANEL BY RT-PCR (FLU A&B, COVID) ARPGX2
Influenza A by PCR: NEGATIVE
Influenza B by PCR: NEGATIVE
SARS Coronavirus 2 by RT PCR: NEGATIVE

## 2020-11-09 MED ORDER — DIBUCAINE (PERIANAL) 1 % EX OINT: 1.0000 "application " | TOPICAL_OINTMENT | CUTANEOUS | Status: DC | PRN

## 2020-11-09 MED ORDER — SIMETHICONE 80 MG PO CHEW: 80.0000 mg | CHEWABLE_TABLET | ORAL | Status: DC | PRN

## 2020-11-09 MED ORDER — HYDROXYZINE HCL 50 MG PO TABS
50.0000 mg | ORAL_TABLET | Freq: Four times a day (QID) | ORAL | Status: DC | PRN
  Filled 2020-11-09: qty 1

## 2020-11-09 MED ORDER — FLEET ENEMA 7-19 GM/118ML RE ENEM: 1.0000 | ENEMA | Freq: Every day | RECTAL | Status: DC | PRN

## 2020-11-09 MED ORDER — MEASLES, MUMPS & RUBELLA VAC IJ SOLR
0.5000 mL | Freq: Once | INTRAMUSCULAR | Status: DC
  Filled 2020-11-09: qty 0.5

## 2020-11-09 MED ORDER — SENNOSIDES-DOCUSATE SODIUM 8.6-50 MG PO TABS: 2.0000 | ORAL_TABLET | ORAL | Status: DC

## 2020-11-09 MED ORDER — BUTORPHANOL TARTRATE 1 MG/ML IJ SOLN
1.0000 mg | INTRAMUSCULAR | Status: DC | PRN
  Administered 2020-11-09: 1 mg via INTRAVENOUS
  Filled 2020-11-09: qty 1

## 2020-11-09 MED ORDER — LIDOCAINE HCL (PF) 1 % IJ SOLN: 30.0000 mL | INTRAMUSCULAR | Status: DC | PRN

## 2020-11-09 MED ORDER — MISOPROSTOL 200 MCG PO TABS
200.0000 ug | ORAL_TABLET | ORAL | Status: DC | PRN
  Administered 2020-11-09: 200 ug via VAGINAL
  Filled 2020-11-09 (×2): qty 1

## 2020-11-09 MED ORDER — CARBOPROST TROMETHAMINE 250 MCG/ML IM SOLN
INTRAMUSCULAR | Status: AC
  Filled 2020-11-09: qty 1

## 2020-11-09 MED ORDER — TETANUS-DIPHTH-ACELL PERTUSSIS 5-2.5-18.5 LF-MCG/0.5 IM SUSY
0.5000 mL | PREFILLED_SYRINGE | Freq: Once | INTRAMUSCULAR | Status: DC
  Filled 2020-11-09: qty 0.5

## 2020-11-09 MED ORDER — SODIUM CHLORIDE 0.9% FLUSH: 3.0000 mL | Freq: Two times a day (BID) | INTRAVENOUS | Status: DC

## 2020-11-09 MED ORDER — ONDANSETRON HCL 4 MG PO TABS: 4.0000 mg | ORAL_TABLET | ORAL | Status: DC | PRN

## 2020-11-09 MED ORDER — DIPHENHYDRAMINE HCL 25 MG PO CAPS: 25.0000 mg | ORAL_CAPSULE | Freq: Four times a day (QID) | ORAL | Status: DC | PRN

## 2020-11-09 MED ORDER — SOD CITRATE-CITRIC ACID 500-334 MG/5ML PO SOLN: 30.0000 mL | ORAL | Status: DC | PRN

## 2020-11-09 MED ORDER — OXYCODONE HCL 5 MG PO TABS: 5.0000 mg | ORAL_TABLET | ORAL | Status: DC | PRN

## 2020-11-09 MED ORDER — BENZOCAINE-MENTHOL 20-0.5 % EX AERO: 1.0000 "application " | INHALATION_SPRAY | CUTANEOUS | Status: DC | PRN

## 2020-11-09 MED ORDER — ACETAMINOPHEN 325 MG PO TABS
650.0000 mg | ORAL_TABLET | ORAL | Status: DC | PRN
  Administered 2020-11-10: 650 mg via ORAL
  Filled 2020-11-09: qty 2

## 2020-11-09 MED ORDER — OXYTOCIN BOLUS FROM INFUSION: 333.0000 mL | Freq: Once | INTRAVENOUS | Status: DC

## 2020-11-09 MED ORDER — IBUPROFEN 600 MG PO TABS
600.0000 mg | ORAL_TABLET | Freq: Four times a day (QID) | ORAL | Status: DC
  Administered 2020-11-10: 600 mg via ORAL
  Filled 2020-11-09: qty 1

## 2020-11-09 MED ORDER — DIPHENOXYLATE-ATROPINE 2.5-0.025 MG PO TABS
ORAL_TABLET | ORAL | Status: AC
  Filled 2020-11-09: qty 2

## 2020-11-09 MED ORDER — LACTATED RINGERS IV SOLN: INTRAVENOUS | Status: DC

## 2020-11-09 MED ORDER — SODIUM CHLORIDE 0.9 % IV SOLN: 250.0000 mL | INTRAVENOUS | Status: DC | PRN

## 2020-11-09 MED ORDER — BISACODYL 10 MG RE SUPP: 10.0000 mg | Freq: Every day | RECTAL | Status: DC | PRN

## 2020-11-09 MED ORDER — AMMONIA AROMATIC IN INHA
RESPIRATORY_TRACT | Status: AC
  Filled 2020-11-09: qty 10

## 2020-11-09 MED ORDER — OXYTOCIN 10 UNIT/ML IJ SOLN
INTRAMUSCULAR | Status: AC
  Filled 2020-11-09: qty 2

## 2020-11-09 MED ORDER — COCONUT OIL OIL: 1.0000 "application " | TOPICAL_OIL | Status: DC | PRN

## 2020-11-09 MED ORDER — SODIUM CHLORIDE 0.9% FLUSH: 3.0000 mL | INTRAVENOUS | Status: DC | PRN

## 2020-11-09 MED ORDER — LACTATED RINGERS IV SOLN: 500.0000 mL | INTRAVENOUS | Status: DC | PRN

## 2020-11-09 MED ORDER — ACETAMINOPHEN 325 MG PO TABS: 650.0000 mg | ORAL_TABLET | ORAL | Status: DC | PRN

## 2020-11-09 MED ORDER — ZOLPIDEM TARTRATE 5 MG PO TABS: 5.0000 mg | ORAL_TABLET | Freq: Every evening | ORAL | Status: DC | PRN

## 2020-11-09 MED ORDER — ONDANSETRON HCL 4 MG/2ML IJ SOLN: 4.0000 mg | Freq: Four times a day (QID) | INTRAMUSCULAR | Status: DC | PRN

## 2020-11-09 MED ORDER — WITCH HAZEL-GLYCERIN EX PADS: 1.0000 "application " | MEDICATED_PAD | CUTANEOUS | Status: DC | PRN

## 2020-11-09 MED ORDER — ONDANSETRON HCL 4 MG/2ML IJ SOLN: 4.0000 mg | INTRAMUSCULAR | Status: DC | PRN

## 2020-11-09 MED ORDER — MISOPROSTOL 200 MCG PO TABS
400.0000 ug | ORAL_TABLET | ORAL | Status: AC | PRN
  Administered 2020-11-09: 400 ug via VAGINAL
  Filled 2020-11-09: qty 2

## 2020-11-09 MED ORDER — LIDOCAINE HCL (PF) 1 % IJ SOLN
INTRAMUSCULAR | Status: AC
  Filled 2020-11-09: qty 30

## 2020-11-09 MED ORDER — OXYTOCIN-SODIUM CHLORIDE 30-0.9 UT/500ML-% IV SOLN
2.5000 [IU]/h | INTRAVENOUS | Status: DC
  Filled 2020-11-09: qty 1000

## 2020-11-09 MED ORDER — DIPHENHYDRAMINE HCL 50 MG/ML IJ SOLN
INTRAMUSCULAR | Status: AC
  Filled 2020-11-09: qty 1

## 2020-11-09 MED ORDER — MISOPROSTOL 200 MCG PO TABS
ORAL_TABLET | ORAL | Status: AC
  Filled 2020-11-09: qty 4

## 2020-11-09 NOTE — H&P (Signed)
OB History & Physical   History of Present Illness:  Chief Complaint: induction of labor due to demise  HPI:  Samantha Levy is a 29 y.o. [id number] female at [redacted]w[redacted]d dated by early [place] at 6wks, with EDD 03/04/21.  She presents to L&D for induction of labor with an IUFD at 23+3wks from likely placental insufficiency.   Pregnancy history: G4W1027   EDC : 03/04/2021, by 6 week ultrasound inconsistent with dates Prenatal care at [facility] Prenatal course complicated by  - FGR, with normal fetal echo, + hyperechogenic bowel, with reversed end diastolic flow on UAD. No gross anatomic abnormalities.             - CMV, toxo and CF screening neg             - Elevated AFP 1:178, MoM below 3             - Hx of tobacco use in pregnancy             - has seen Dentist. 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 AFP screen positive. Declined amnio             - see ultrasound findings below  - depression/anxiety, on zoloft 100mg  as of 10/1. ED 22 -hx of forceps assisted birth with G1 - hx of subchorionic hemorrhage this pregnancy, last 3x2cm on right 09/24/20  Maternal Medical History:   Past Medical History:  Diagnosis Date  . Medical history non-contributory     Past Surgical History:  Procedure Laterality Date  . NO PAST SURGERIES      No Known Allergies  Prior to Admission medications   Medication Sig Start Date End Date Taking? Authorizing Provider  Prenatal Vit-Fe Fumarate-FA (MULTIVITAMIN-PRENATAL) 27-0.8 MG TABS tablet Take 1 tablet by mouth daily at 12 noon.   Yes [provider]  sertraline (ZOLOFT) 100 MG tablet Take 100 mg by mouth daily.   Yes [provider]  acetaminophen (TYLENOL) 500 MG tablet Take 2 tablets (1,000 mg total) by mouth every 6 (six) hours as needed for mild pain or moderate pain. 09/18/19   Genia Del, CNM     Prenatal care site: [facility] with MFM consult  Social History: She  reports that she quit smoking about 20 months ago.  Her smoking use included cigarettes. She smoked 0.50 packs per day. She has never used smokeless tobacco. She reports that she does not drink alcohol and does not use drugs.  Family History: family history includes Diabetes type II in her mother.   Review of Systems: A full review of systems was performed and negative except as noted in the HPI.     Physical Exam:  Vital Signs: BP 112/74 (BP Location: Left Leg)   Pulse 83   Temp 98.3 F (36.8 C) (Oral)   Resp 18   Ht 5\' 1"  (1.549 m)   Wt 59.9 kg   LMP  (LMP Unknown)   BMI 24.94 kg/m  General: no acute distress. Flat affect, pt with minimal responses to questions. Teary-eyed.  HEENT: normocephalic, atraumatic Heart: regular rate & rhythm.  No murmurs/rubs/gallops Lungs: clear to auscultation bilaterally, normal respiratory effort Abdomen: soft, gravid, non-tender;  EFW: 321g ( 11 oz) on last US  Pelvic:   External: Normal external female genitalia  Cervix: Dilation: Closed / Effacement (%): 50 /      Extremities: non-tender, symmetric, no edema bilaterally.  DTRs: 2+  Neurologic: Alert & oriented x 3.  Results for orders placed or performed during the hospital encounter of 11/09/20 (from the past 24 hour(s))  Resp Panel by RT-PCR (Flu A&B, Covid) Nasopharyngeal Swab     Status: None   Collection Time: 11/09/20 12:28 PM   Specimen: Nasopharyngeal Swab; Nasopharyngeal(NP) swabs in vial transport medium  Result Value Ref Range   SARS Coronavirus 2 by RT PCR NEGATIVE NEGATIVE   Influenza A by PCR NEGATIVE NEGATIVE   Influenza B by PCR NEGATIVE NEGATIVE  CBC     Status: None   Collection Time: 11/09/20  1:22 PM  Result Value Ref Range   WBC 10.5 4.0 - 10.5 K/uL   RBC 4.26 3.87 - 5.11 MIL/uL   Hemoglobin 13.6 12.0 - 15.0 g/dL   HCT 56.3 36 - 46 %   MCV 93.4 80.0 - 100.0 fL   MCH 31.9 26.0 - 34.0 pg   MCHC 34.2 30.0 - 36.0 g/dL   RDW 89.3 73.4 - 28.7 %   Platelets 214 150 - 400 K/uL   nRBC 0.0 0.0 - 0.2 %  Type and screen      Status: None   Collection Time: 11/09/20  1:22 PM  Result Value Ref Range   ABO/RH(D) O POS    Antibody Screen NEG    Sample Expiration      11/12/2020,2359 Performed at [facility], [address] [place], [place] [address]     Pertinent Results:  Prenatal Labs: Blood type/Rh Opos  Antibody screen neg  Rubella Immune  Varicella Immune  RPR NR  HBsAg Neg  HIV NR  GC neg  Chlamydia neg  Genetic screening Negative MaterniT21, Pos AFP (elevated).   1 hour GTT  n/a  3 hour GTT  --  GBS  n/a   SVE:  Dilation: Closed / Effacement (%): 50 /      Cephalic by bedside US on admit.   [place] MFM OB FOLLOW UP  Result Date: 10/25/2020 ----------------------------------------------------------------------  OBSTETRICS REPORT                       (Signed Final 10/25/2020 12:38 pm) ---------------------------------------------------------------------- Patient Info  ID #:       [id number]                          D.O.B.:  1991-01-20 (29 yrs)  Name:       Samantha Levy            Visit Date: 10/25/2020 09:26 am ---------------------------------------------------------------------- Performed By  Attending:        Noralee Space MD        Ref. Address:     [address]                                                             [place], [place],                                                             [place] [address]  Performed By:     Raynelle Fanning  Faucette         [facility]                                     F[facility] at                                                             [facility]  Referred By:      Leola Brazil MD ---------------------------------------------------------------------- Orders  #  Description                           Code        Ordered By  1  [place] MFM OB FOLLOW UP                   76816.01    CHELSEA WARD  2  [place] MFM UA CORD DOPPLER                76820.02    CHELSEA WARD  ----------------------------------------------------------------------  #  Order #                     Accession #                Episode #  1  [id number]                   [id number]                 [id number]  2  [id number]                   [id number]                 [id number] ---------------------------------------------------------------------- Indications  [redacted] weeks gestation of pregnancy                Z3A.21  Encounter for raised alfafetoprotein level     Z36.1  Tobacco use complicating pregnancy             O99.330  Poor obstetric history: Previous preterm       O09.219  delivery, antepartum  Maternal care for known or suspected poor      O36.5921  fetal growth, second trimester, fetus 1 IUGR ---------------------------------------------------------------------- Fetal Evaluation  Num Of Fetuses:         1  Fetal Heart Rate(bpm):  153  Cardiac Activity:       Observed  Presentation:           Cephalic  Placenta:               Right lateral                              Largest Pocket(cm)  2.2 ---------------------------------------------------------------------- Biometry  BPD:      44.2  mm     G. Age:  19w 3d        1.1  %    CI:        67.57   %    70 - 86                                                          FL/HC:      17.6   %    15.9 - 20.3  HC:      172.1  mm     G. Age:  19w 5d        1.3  %    HC/AC:      1.13        1.06 - 1.25  AC:      152.7  mm     G. Age:  20w 3d         16  %    FL/BPD:     68.6   %  FL:       30.3  mm     G. Age:  19w 3d        1.5  %    FL/AC:      19.8   %    20 - 24  HUM:      28.7  mm     G. Age:  19w 2d        < 5  %  Est. FW:     321  gm    0 lb 11 oz     2.2  % ---------------------------------------------------------------------- Gestational Age  LMP:           22w 1d        Date:  05/23/20                 EDD:   02/27/21  U/S Today:     19w 5d                                        EDD:   03/16/21  Best:          [place] 3d     Det. By:  Marcella Dubs         EDD:   03/04/21                                      (07/12/20) ---------------------------------------------------------------------- Anatomy  Cranium:               Previously seen        Aortic Arch:            Previously seen  Cavum:                 Appears normal         Ductal Arch:            Previously seen  Ventricles:            Appears normal  Diaphragm:              Appears normal  Choroid Plexus:        Previously seen        Stomach:                Appears normal, left                                                                        sided  Cerebellum:            Previously seen        Abdomen:                Echogenic Bowel  Posterior Fossa:       Previously seen        Abdominal Wall:         Previously seen  Nuchal Fold:           Previously seen        Cord Vessels:           Previously seen  Face:                  Orbits and profile     Kidneys:                Appear normal                         previously seen  Lips:                  Previously seen        Bladder:                Appears normal  Thoracic:              Appears normal         Spine:                  Appears normal  Heart:                 Appears normal         Upper Extremities:      Previously seen                         (4CH, axis, and                         situs)  RVOT:                  Previously seen        Lower Extremities:      Previously seen  LVOT:                  Previously seen ---------------------------------------------------------------------- Doppler - Fetal Vessels  Umbilical Artery   S/D     %tile      RI    %tile      PI    %tile            ADFV    RDFV   9.54   > 97.5  0.9   > 97.5    1.67   > 97.5              Yes      No ---------------------------------------------------------------------- Cervix Uterus Adnexa  Cervix  Length:           4.29  cm. ---------------------------------------------------------------------- Impression  Early-onset fetal growth restriction.  Patient returned for fetal  growth assessment and umbilical artery Doppler studies.  Following her previous visit, after my communication with Dr.  Elesa MassedWard (EPIC message), I had amended her EDD to 03/04/20  and it is established by her early ultrasound. Patient feels her  LMP sure but is unsure of her cycle lengths. She also gives  history of irregular menstrual bleeding.  Early ultrasound dating is very accurate, and I recommend  we establish her EDD based on 6-week ultrasound.  We had informed the patient of negative toxoplasmosis and  CMV screening, and that she is not a carrier of cystic fibrosis  mutation.  She does not have hypertension or any other chronic medical  conditions.  On today's ultrasound, the estimated fetal weight is at the 2nd  percentile. Head circumference measurement is at -1 SD (no  evidence of microcephaly). Amniotic fluid is normal and good  fetal activity is seen. Hyperechogenic bowel is seen again.  No obvious fetal structural defects are seen.  Umbilical artery Doppler showed intermittent absent-end-  diastolic flow.  I explained the findings and counseled her.  -Severe fetal growth restriction is based on the finding of  estimated fetal weight below the 3rd percentile.  -Worsening of umbilical artery Doppler study is seen on  today's ultrasound.  -More-frequent monitoring would be recommended after  viable gestational age 57(23 to 24 weeks) is reached.  -Discussed the benefit of antenatal corticosteroids that would  be recommended if UA Doppler study showed absent or  reversed-flow.  -Delivery at 24 weeks' gestation is associated with a high  perinatal mortality and morbidity rates. Patient has an option  of expectant management till later gestational age (28 weeks  or later). However, expectant management is associated with  an increased risk of stillbirth.  -We will obtain neonatal consultation after next fetal growth  assessment that would help patient decide on timing of  delivery.  -I  discussed amniocentesis again. Early-onset fetal growth  restriction is associated with fetal chromosomal anomalies.  Patient opted not to have amniocentesis. ---------------------------------------------------------------------- Recommendations  -An appointment was made for her to return in 2 weeks for  umbilical artery Doppler study.  -Fetal growth assessment in 3 weeks and neonatal  consultation. Patient was advised to bring some family  member for neonatal consultation.  -Fetal growth assessment may be considered in 2 weeks if  reversed-end-diastolic flow is seen on umbilical artery  Doppler.  -We have requested an appointment for fetal  echocardiography. ----------------------------------------------------------------------                  Noralee Spaceavi Shankar, MD Electronically Signed Final Report   10/25/2020 12:38 pm ----------------------------------------------------------------------  US MFM OB LIMITED  Result Date: 11/08/2020 ----------------------------------------------------------------------  OBSTETRICS REPORT                       (Signed Final 11/08/2020 11:42 am) ---------------------------------------------------------------------- Patient Info  ID #:       [id number]                          D.O.B.:  30-Apr-1991 (29  yrs)  Name:       Samantha Levy            Visit Date: 11/08/2020 10:09 am ---------------------------------------------------------------------- Performed By  Attending:        Noralee Space MD        Ref. Address:     [address]                                                             [place], [place],                                                             [place] [address]  Performed By:     Loretha Brasil       Location[facility]         [facility]                                                             Fetal Care at                                                             [facility]  Referred By:      Leola Brazil MD  ---------------------------------------------------------------------- Orders  #  Description                           Code        Ordered By  1  [place] MFM OB LIMITED                     [phone]    CHELSEA WARD ----------------------------------------------------------------------  #  Order #                     Accession #                Episode #  1  [id number]                   [id number]                 [id number] ---------------------------------------------------------------------- Indications  [redacted] weeks gestation of pregnancy                Z3A.23  Tobacco use complicating pregnancy,            O99.332  second trimester  Maternal care for known or suspected poor      O36.5920  fetal growth, second trimester, not applicable  or unspecified IUGR ---------------------------------------------------------------------- Fetal Evaluation  Num Of Fetuses:         1  Cardiac Activity:       Not visualized                              Largest Pocket(cm)                              1.3 ---------------------------------------------------------------------- Gestational Age  LMP:           24w 1d        Date:  05/23/20                 EDD:   02/27/21  Best:          23w 3d     Det. By:  Marcella Dubs         EDD:   03/04/21                                      (07/12/20) ---------------------------------------------------------------------- Cervix Uterus Adnexa  Cervix  Length:           3.01  cm. ---------------------------------------------------------------------- Impression  Severe fetal growth restriction.  Patient returned for umbilical artery Doppler study. She had  fetal echocardiography on 11/01/20 at [facility] (Dr. Mayer Camel). No structural heart defects were seen.  Mild pericardial effusion was seen. Umbilical artery Doppler  showed reversed-end-diastolic flow.  On today's ultrasound, oligohydramnios is seen (MVP 1 cm).  Cephalic presentation. Unfortunately, no fetal heart activity is  seen.   Impression: Fetal demise.  I counseled the patient (and later her husband) on the  findings. Most-likely cause is placental insufficiency. Fetal  chromosomal anomaly is also likely. Postnatal microarray  would be helpful.  Dr. Dalbert Garnet came to our office and counseled the patient.  She will be scheduled for induction of labor (misoprostol) by  Dr. Francena Hanly team. ----------------------------------------------------------------------                  Noralee Space, MD Electronically Signed Final Report   11/08/2020 11:42 am ----------------------------------------------------------------------  [place] MFM UA CORD DOPPLER  Result Date: 10/25/2020 ----------------------------------------------------------------------  OBSTETRICS REPORT                       (Signed Final 10/25/2020 12:38 pm) ---------------------------------------------------------------------- Patient Info  ID #:       [id number]                          D.O.B.:  09/25/1991 (29 yrs)  Name:       Samantha Levy            Visit Date: 10/25/2020 09:26 am ---------------------------------------------------------------------- Performed By  Attending:        Noralee Space MD        Ref. Address:     [address] [place],  [place] [address]  Performed By:     Criselda Peaches         [facility]         [facility]                                     Fetal Care at                                                             [facility]  Referred By:      Leola Brazil MD ---------------------------------------------------------------------- Orders  #  Description                           Code        Ordered By  1  [place] MFM OB FOLLOW UP                   76816.01    CHELSEA WARD  2  [place] MFM UA CORD DOPPLER                76820.02    CHELSEA WARD  ----------------------------------------------------------------------  #  Order #                     Accession #                Episode #  1  [id number]                   [id number]                 [id number]  2  [id number]                   [id number]                 [id number] ---------------------------------------------------------------------- Indications  [redacted] weeks gestation of pregnancy                Z3A.21  Encounter for raised alfafetoprotein level     Z36.1  Tobacco use complicating pregnancy             O99.330  Poor obstetric history: Previous preterm       O09.219  delivery, antepartum  Maternal care for known or suspected poor      O36.5921  fetal growth, second trimester, fetus 1 IUGR ---------------------------------------------------------------------- Fetal Evaluation  Num Of Fetuses:         1  Fetal Heart Rate(bpm):  153  Cardiac Activity:       Observed  Presentation:           Cephalic  Placenta:               Right lateral                              Largest Pocket(cm)  2.2 ---------------------------------------------------------------------- Biometry  BPD:      44.2  mm     G. Age:  19w 3d        1.1  %    CI:        67.57   %    70 - 86                                                          FL/HC:      17.6   %    15.9 - 20.3  HC:      172.1  mm     G. Age:  19w 5d        1.3  %    HC/AC:      1.13        1.06 - 1.25  AC:      152.7  mm     G. Age:  20w 3d         16  %    FL/BPD:     68.6   %  FL:       30.3  mm     G. Age:  19w 3d        1.5  %    FL/AC:      19.8   %    20 - 24  HUM:      28.7  mm     G. Age:  19w 2d        < 5  %  Est. FW:     321  gm    0 lb 11 oz     2.2  % ---------------------------------------------------------------------- Gestational Age  LMP:           22w 1d        Date:  05/23/20                 EDD:   02/27/21  U/S Today:     19w 5d                                        EDD:   03/16/21  Best:          [place] 3d     Det. By:  Marcella Dubs         EDD:   03/04/21                                      (07/12/20) ---------------------------------------------------------------------- Anatomy  Cranium:               Previously seen        Aortic Arch:            Previously seen  Cavum:                 Appears normal         Ductal Arch:            Previously seen  Ventricles:            Appears normal  Diaphragm:              Appears normal  Choroid Plexus:        Previously seen        Stomach:                Appears normal, left                                                                        sided  Cerebellum:            Previously seen        Abdomen:                Echogenic Bowel  Posterior Fossa:       Previously seen        Abdominal Wall:         Previously seen  Nuchal Fold:           Previously seen        Cord Vessels:           Previously seen  Face:                  Orbits and profile     Kidneys:                Appear normal                         previously seen  Lips:                  Previously seen        Bladder:                Appears normal  Thoracic:              Appears normal         Spine:                  Appears normal  Heart:                 Appears normal         Upper Extremities:      Previously seen                         (4CH, axis, and                         situs)  RVOT:                  Previously seen        Lower Extremities:      Previously seen  LVOT:                  Previously seen ---------------------------------------------------------------------- Doppler - Fetal Vessels  Umbilical Artery   S/D     %tile      RI    %tile      PI    %tile            ADFV    RDFV   9.54   > 97.5  0.9   > 97.5    1.67   > 97.5              Yes      No ---------------------------------------------------------------------- Cervix Uterus Adnexa  Cervix  Length:           4.29  cm. ---------------------------------------------------------------------- Impression  Early-onset fetal growth restriction.  Patient returned for fetal  growth assessment and umbilical artery Doppler studies.  Following her previous visit, after my communication with Dr.  Elesa Massed (EPIC message), I had amended her EDD to 03/04/20  and it is established by her early ultrasound. Patient feels her  LMP sure but is unsure of her cycle lengths. She also gives  history of irregular menstrual bleeding.  Early ultrasound dating is very accurate, and I recommend  we establish her EDD based on 6-week ultrasound.  We had informed the patient of negative toxoplasmosis and  CMV screening, and that she is not a carrier of cystic fibrosis  mutation.  She does not have hypertension or any other chronic medical  conditions.  On today's ultrasound, the estimated fetal weight is at the 2nd  percentile. Head circumference measurement is at -1 SD (no  evidence of microcephaly). Amniotic fluid is normal and good  fetal activity is seen. Hyperechogenic bowel is seen again.  No obvious fetal structural defects are seen.  Umbilical artery Doppler showed intermittent absent-end-  diastolic flow.  I explained the findings and counseled her.  -Severe fetal growth restriction is based on the finding of  estimated fetal weight below the 3rd percentile.  -Worsening of umbilical artery Doppler study is seen on  today's ultrasound.  -More-frequent monitoring would be recommended after  viable gestational age (10 to 24 weeks) is reached.  -Discussed the benefit of antenatal corticosteroids that would  be recommended if UA Doppler study showed absent or  reversed-flow.  -Delivery at 24 weeks' gestation is associated with a high  perinatal mortality and morbidity rates. Patient has an option  of expectant management till later gestational age (28 weeks  or later). However, expectant management is associated with  an increased risk of stillbirth.  -We will obtain neonatal consultation after next fetal growth  assessment that would help patient decide on timing of  delivery.  -I  discussed amniocentesis again. Early-onset fetal growth  restriction is associated with fetal chromosomal anomalies.  Patient opted not to have amniocentesis. ---------------------------------------------------------------------- Recommendations  -An appointment was made for her to return in 2 weeks for  umbilical artery Doppler study.  -Fetal growth assessment in 3 weeks and neonatal  consultation. Patient was advised to bring some family  member for neonatal consultation.  -Fetal growth assessment may be considered in 2 weeks if  reversed-end-diastolic flow is seen on umbilical artery  Doppler.  -We have requested an appointment for fetal  echocardiography. ----------------------------------------------------------------------                  Noralee Space, MD Electronically Signed Final Report   10/25/2020 12:38 pm ----------------------------------------------------------------------   Assessment:  Samantha Levy is a 29 y.o. [phone] female at [redacted]w[redacted]d with IUFD likely due to placental insufficiency.   Plan:  1. Admit to Labor & Delivery; consents reviewed and obtained Induction of labor with misoprostol, cephalic presentation confirmed on ultrasound prior to admission. - misoprostol PV q4hrs - epidural on request. Other iv pain meds on request  Plan for [facility] POC chromosome array from placenta, codes [id number] with mat cell conf [id number].  -placenta  to pathology - Pt declined chaplain consult on admit.    Randa Ngo, CNM 11/09/20 4:53 PM

## 2020-11-10 ENCOUNTER — Encounter: Payer: Self-pay | Admitting: Obstetrics and Gynecology

## 2020-11-10 LAB — CBC
HCT: 39.1 % (ref 36.0–46.0)
Hemoglobin: 13.5 g/dL (ref 12.0–15.0)
MCH: 32.5 pg (ref 26.0–34.0)
MCHC: 34.5 g/dL (ref 30.0–36.0)
MCV: 94 fL (ref 80.0–100.0)
Platelets: 179 10*3/uL (ref 150–400)
RBC: 4.16 MIL/uL (ref 3.87–5.11)
RDW: 13.4 % (ref 11.5–15.5)
WBC: 9.8 10*3/uL (ref 4.0–10.5)
nRBC: 0 % (ref 0.0–0.2)

## 2020-11-10 LAB — RPR: RPR Ser Ql: NONREACTIVE

## 2020-11-10 NOTE — Discharge Summary (Signed)
Obstetrical Discharge Summary  Patient Name: Samantha Levy DOB: 22-Jun-1991 MRN: [id number]  Date of Admission: 11/09/2020 Date of Discharge: 11/10/2020  Primary OB: [facility]   Gestational Age at Delivery: [redacted]w[redacted]d   Antepartum complications:  [id number]  EDC : 03/04/2021, by6 week ultrasound inconsistent with dates Prenatal care [facility] Prenatal course complicated by - FGR, with normal fetal echo, + hyperechogenic bowel, with reversed end diastolic flow on UAD. No gross anatomic abnormalities. - CMV, toxo and CF screening neg - Elevated AFP 1:178, MoM below 3 - Hx of tobacco use in pregnancy - has seen Dentist. 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 AFP screen positive. Declined amnio - see ultrasound findings below  - depression/anxiety, on zoloft 100mg  as of 10/1. ED 22 -hx of forceps assisted birth with G1 - hx of subchorionic hemorrhage this pregnancy, last 3x2cm on right 09/24/20 Admitting Diagnosis: intrauterine fetal demise, second trimester Secondary Diagnosis: Patient Active Problem List   Diagnosis Date Noted  . Fetal demise, greater than 22 weeks, antepartum 11/09/2020  . Labor and delivery indication for care or intervention 09/17/2019  . Pregnancy 05/06/2019  . Preterm labor with preterm delivery 02/14/2018  . Tobacco abuse 08/20/2017   Intrapartum complications/course: 10/20/2017 vaginal cytotec x2,  Date of Delivery: 10/09/20 Delivered By: 10/11/20 Delivery Type: spontaneous vaginal delivery and spontaneous abortion Anesthesia: iv stadol Placenta: Spontaneous  Newborn Data: Still born female "Sophia" Birth Weight: 300  Newborn Delivery   Birth date/time: 11/09/2020 19:45:00 Delivery type: Vaginal, Spontaneous       Brief Hospital Course  DINORA HEMM is a Trinna Balloon who had a SVD on 11/09/20;  for further details of this delivery, please refer to the delivery note.   Patient had an uncomplicated postpartum course.  By time of discharge on PPD#1, her pain was controlled on oral pain medications; she had appropriate lochia and was ambulating, voiding without difficulty and tolerating regular diet.  She was deemed stable for discharge to home.   Discharge Physical Exam:  BP 104/63   Pulse 62   Temp 98 F (36.7 C) (Oral)   Resp 18   Ht 5\' 1"  (1.549 m)   Wt 59.9 kg   LMP  (LMP Unknown)   BMI 24.94 kg/m   General: NAD CV: RRR Pulm: CTABL, nl effort ABD: s/nd/nt Lochia: moderate DVT Evaluation: LE non-ttp, no evidence of DVT on exam.  Hemoglobin  Date Value Ref Range Status  11/10/2020 13.5 12.0 - 15.0 g/dL Final  14/12/2019 11.1 - 15.9 g/dL Final   HCT  Date Value Ref Range Status  11/10/2020 39.1 36 - 46 % Final   Hematocrit  Date Value Ref Range Status  01/08/2018 36.4 34.0 - 46.6 % Final    Post partum course: uncomplicated Postpartum Procedures: none Edinburgh:  Edinburgh Postnatal Depression Scale Screening Tool 11/10/2020 09/18/2019 09/18/2019 09/17/2019 03/28/2018  I have been able to laugh and see the funny side of things. 1 0 (No Data) (No Data) 0  I have looked forward with enjoyment to things. 1 0 - - 0  I have blamed myself unnecessarily when things went wrong. 1 2 - - 2  I have been anxious or worried for no good reason. 1 2 - - 2  I have felt scared or panicky for no good reason. 1 2 - - 1  Things have been getting on top of me. 1 1 - - 2  I have been so unhappy that I have had difficulty sleeping. 1 0 - -  0  I have felt sad or miserable. 1 0 - - 1  I have been so unhappy that I have been crying. 1 1 - - 1  The thought of harming myself has occurred to me. 0 0 - - 0  Edinburgh Postnatal Depression Scale Total 9 8 - - 9    Disposition: stable, discharge to home.  Contraception: not address      Plan:  Samantha Levy was discharged to home in good condition. Follow-up appointment at [facility]   with delivering provider in 1 weeks   Discharge Medications: Allergies as of 11/10/2020   No Known Allergies     Medication List    TAKE these medications   acetaminophen 500 MG tablet Commonly known as: TYLENOL Take 2 tablets (1,000 mg total) by mouth every 6 (six) hours as needed for mild pain or moderate pain.   multivitamin-prenatal 27-0.8 MG Tabs tablet Take 1 tablet by mouth daily at 12 noon.   sertraline 100 MG tablet Commonly known as: ZOLOFT Take 100 mg by mouth daily.        Follow-up Information    Christeen Douglas, MD. Schedule an appointment as soon as possible for a visit in 1 week(s).   Specialty: Obstetrics and Gynecology Contact information: [address] [place] [address] [phone]               Signed: Cyril Mourning 11/10/2020 12:38 PM

## 2020-11-10 NOTE — Progress Notes (Signed)
Pt given discharge instructions including follow up in 1 week, and resources if needed. Pt discharged with husband

## 2020-11-10 NOTE — Discharge Instructions (Signed)
Managing Pregnancy Loss Pregnancy loss can happen any time during a pregnancy. Often the cause is not known. It is rarely because of anything you did. Pregnancy loss in early pregnancy (during the first trimester) is called a miscarriage. This type of pregnancy loss is the most common. Pregnancy loss that happens after 20 weeks of pregnancy is called fetal demise if the baby's heart stops beating before birth. Fetal demise is much less common. Some women experience spontaneous labor shortly after fetal demise resulting in a stillborn birth (stillbirth). Any pregnancy loss can be devastating. You will need to recover both physically and emotionally. Most women are able to get pregnant again after a pregnancy loss and deliver a healthy baby. How to manage emotional recovery  Pregnancy loss is very hard emotionally. You may feel many different emotions while you grieve. You may feel sad and angry. You may also feel guilty. It is normal to have periods of crying. Emotional recovery can take longer than physical recovery. It is different for everyone. Taking these steps can help you in managing this loss:  Remember that it is unlikely you did anything to cause the pregnancy loss.  Share your thoughts and feelings with friends, family, and your partner. Remember that your partner is also recovering emotionally.  Make sure you have a good support system. Do not spend too much time alone.  Meet with a pregnancy loss counselor or join a pregnancy loss support group.  Get enough sleep and eat a healthy diet. Return to regular exercise when you have recovered physically.  Do not use drugs or alcohol to manage your emotions.  Consider seeing a mental health professional to help you recover emotionally.  Ask a friend or loved one to help you decide what to do with any clothing and nursery items you received for your baby. In the case of a stillbirth, many women benefit from taking additional steps in the  grieving process. You may want to:  Hold your baby after the birth.  Name your baby.  Request a birth certificate.  Create a keepsake such as handprints or footprints.  Dress your baby and have a picture taken.  Make funeral arrangements.  Ask for a baptism or blessing. Hospitals have staff members who can help you with all these arrangements. How to recognize emotional stress It is normal to have emotional stress after a pregnancy loss. But emotional stress that lasts a long time or becomes severe requires treatment. Watch out for these signs of severe emotional stress:  Sadness, anger, or guilt that is not going away and is interfering with your normal activities.  Relationship problems that have occurred or gotten worse since the pregnancy loss.  Signs of depression that last longer than 2 weeks. These may include: ? Sadness. ? Anxiety. ? Hopelessness. ? Loss of interest in activities you enjoy. ? Inability to concentrate. ? Trouble sleeping or sleeping too much. ? Loss of appetite or overeating. ? Thoughts of death or of hurting yourself. Follow these instructions at home:  Take over-the-counter and prescription medicines only as told by your health care provider.  Rest at home until your energy level returns. Return to your normal activities as told by your health care provider. Ask your health care provider what activities are safe for you.  When you are ready, meet with your health care provider to discuss steps to take for a future pregnancy.  Keep all follow-up visits as told by your health care provider. This is important.  Where to find support  To help you and your partner with the process of grieving, talk with your health care provider or seek counseling.  Consider meeting with others who have experienced pregnancy loss. Ask your health care provider about support groups and resources. Where to find more information  U.S. Department of Health and Passenger transport manager on Women's Health: http://hoffman.com/  American Pregnancy Association: www.americanpregnancy.org Contact a health care provider if:  You continue to experience grief, sadness, or lack of motivation for everyday activities, and those feelings do not improve over time.  You are struggling to recover emotionally, especially if you are using alcohol or substances to help. Get help right away if:  You have thoughts of hurting yourself or others. If you ever feel like you may hurt yourself or others, or have thoughts about taking your own life, get help right away. You can go to your nearest emergency department or call:  Your local emergency services (911 in the [place]  A suicide crisis helpline, such as the National Suicide Prevention Lifeline at (959)803-1597. This is open 24 hours a day. Summary  Any pregnancy loss can be difficult physically and emotionally.  You may experience many different emotions while you grieve. Emotional recovery can last longer than physical recovery.  It is normal to have emotional stress after a pregnancy loss. But emotional stress that lasts a long time or becomes severe requires treatment.  See your health care provider if you are struggling emotionally after a pregnancy loss. This information is not intended to replace advice given to you by your health care provider. Make sure you discuss any questions you have with your health care provider. Document Revised: 03/19/2019 Document Reviewed: 02/07/2018 Elsevier Patient Education  2020 Elsevier Inc.  [facility] Pregnancy and Infant Loss Support Group  [employer]  The group meets from 5:30 p.m. to 7 p.m., on the second Thursday of each month. It will meet at a clinic near [facility] [address] in [place]. For more information if you'd like to attend, contact Vickii Penna at [phone] or [email or web address].   Alternative Contact: Oneita Kras, MSWA Associate Clinical Professor in the Department of Psychiatry and  OB/GYN at [facility]  [phone]  Here are other local programs: . [employer] offers bereavement doula services.  . The [facility] chapter of [employer] offers support to families after the death of a child.  Marland Kitchen [facility] has a Perinatal Loss Support Group that meets monthly.  Dorene Grebe offers support for parents who have had a baby die. It meets twice a month.  [place] [facility] support groups are held around the country. There are many in the [facility]

## 2020-11-11 ENCOUNTER — Ambulatory Visit: Payer: Medicaid Other

## 2020-11-11 LAB — SURGICAL PATHOLOGY

## 2020-11-21 LAB — POC/TISSUE MICROARRAY

## 2020-11-25 ENCOUNTER — Telehealth: Payer: Self-pay | Admitting: Obstetrics and Gynecology

## 2020-11-25 NOTE — Telephone Encounter (Signed)
I left a voicemail for Samantha Levy to call back if she would like to review the results of the chromosomal microarray testing from her recent pregnancy loss.  I am happy to speak with her about these normal results if she has questions or has not spoken with her provider about them yet. No abnormalities were noted on this testing. We may be reached at [phone].  Cherly Anderson, MS, CGC

## 2021-05-20 IMAGING — US US MFM OB DETAIL+14 WK
2 series · 15 of 28 positions shown · non-contrast
Comparison: none

[Series 1: us mfm ob detail+14 wk · 12 of 101 slices shown (1 of 2)]
[im 1/101]
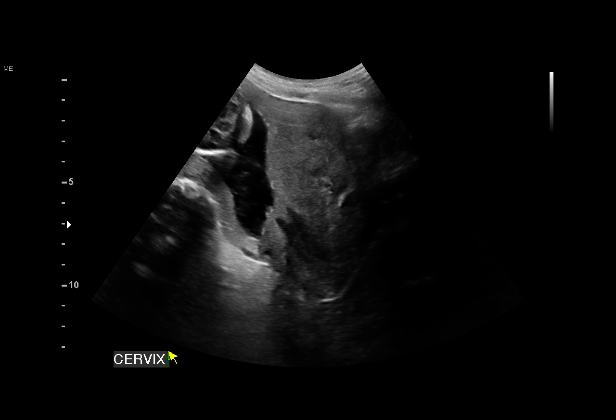
[im 10/101]
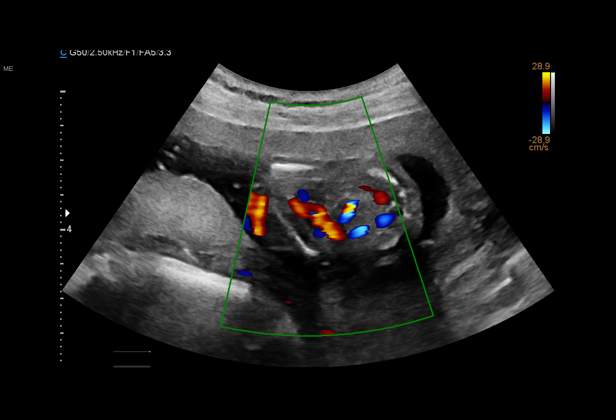
[im 19/101]
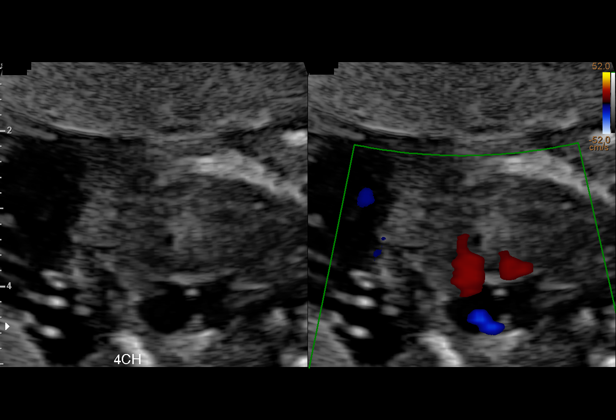
[im 28/101]
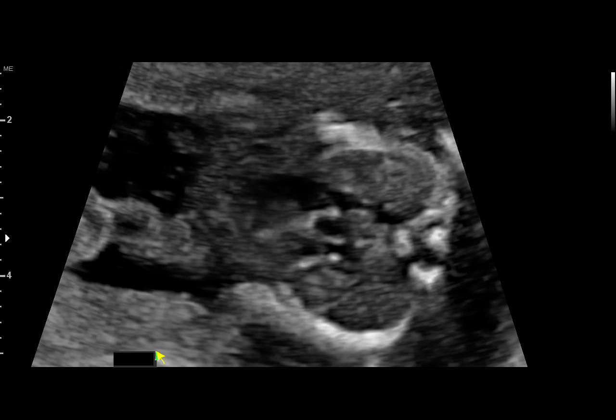
[im 37/101]
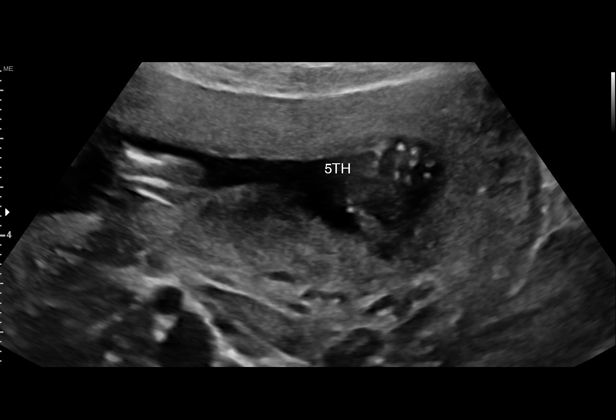
[im 46/101]
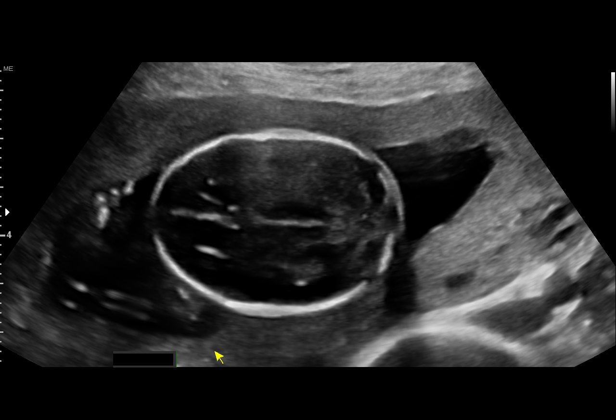
[im 55/101]
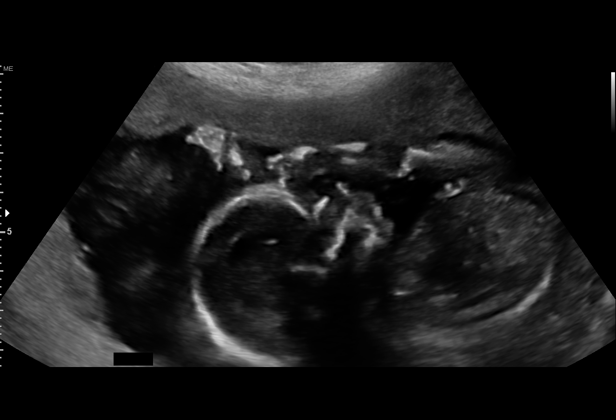
[im 64/101]
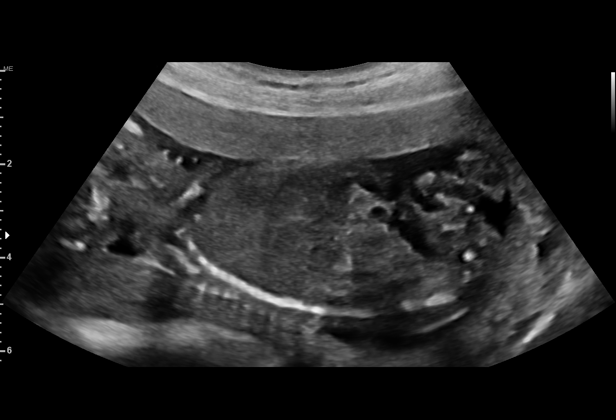
[im 69/101]
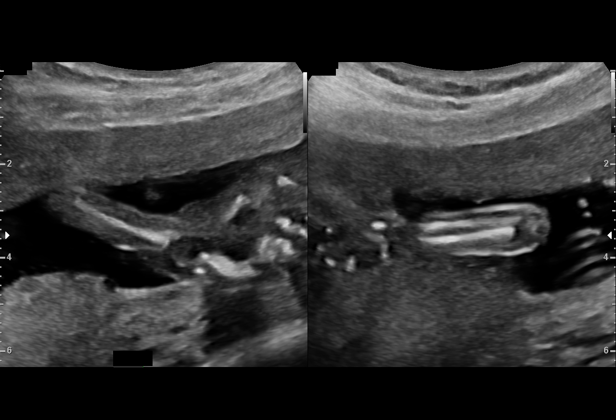
[im 78/101]
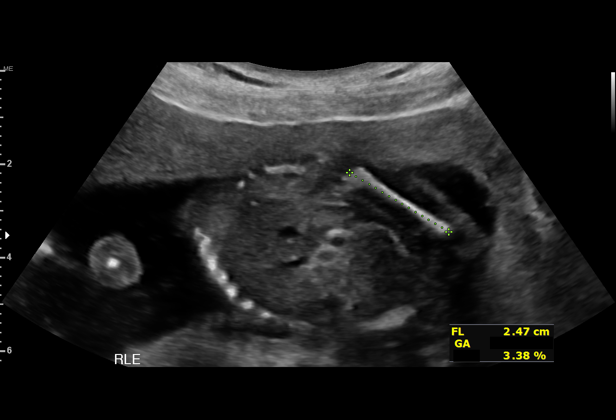
[im 87/101]
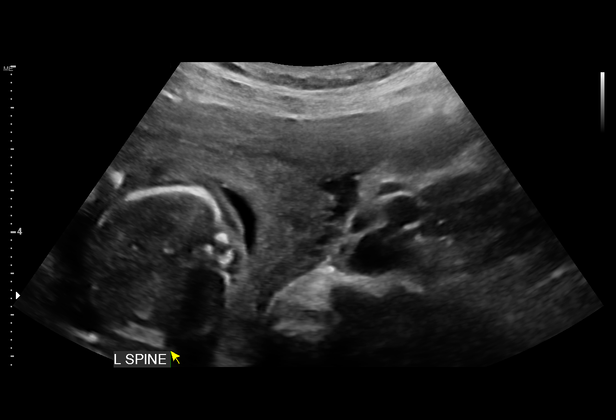
[im 96/101]
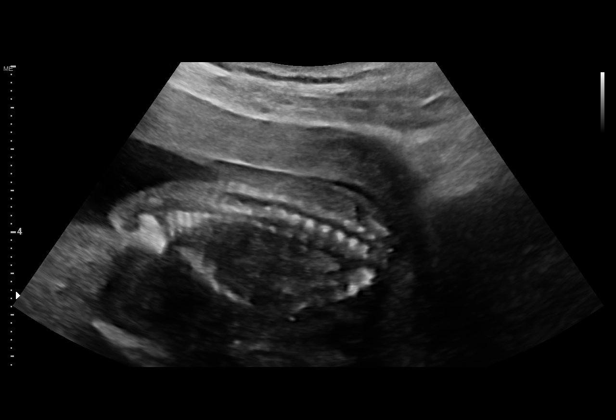

[Series 3: us mfm ob detail+14 wk · 3 of 22 slices shown (2 of 2)]
[im 1/22]
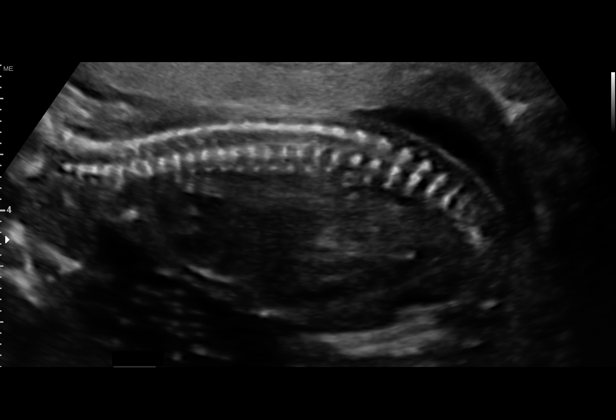
[im 11/22]
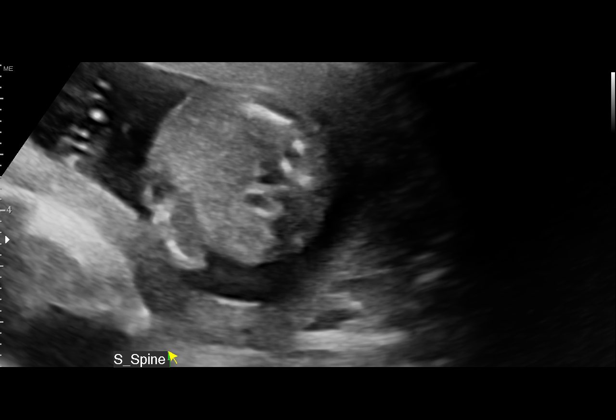
[im 22/22]
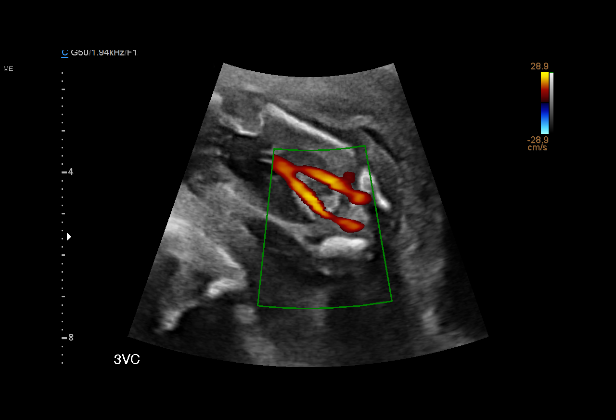

[15 of 28 positions shown; findings below may reference images not displayed]

Maternal [HOSPITAL]
                                                            at [REDACTED]

 2  US MFM UA CORD DOPPLER                76820.02    RAHII KAHN

Indications

 18 weeks gestation of pregnancy
 Encounter for raised alfafetoprotein level
 Tobacco use complicating pregnancy
 Poor obstetric history: Previous preterm
 delivery, antepartum
 Maternal care for known or suspected poor
 fetal growth, second trimester, fetus 1 IUGR
Fetal Evaluation

 Num Of Fetuses:         1
 Fetal Heart Rate(bpm):  155
 Cardiac Activity:       Observed
 Presentation:           Breech
 Placenta:               Right lateral
 P. Cord Insertion:      Visualized, central

                             Largest Pocket(cm)

Biometry

 BPD:      35.6  mm     G. Age:  17w 0d        3.7  %    CI:        63.36   %    70 - 86
                                                         FL/HC:      17.4   %    15.8 - 18
 HC:      144.1  mm     G. Age:  17w 4d         10  %    HC/AC:      1.18        1.07 -
 AC:      122.5  mm     G. Age:  17w 6d         29  %    FL/BPD:     70.5   %
 FL:       25.1  mm     G. Age:  17w 4d         16  %    FL/AC:      20.5   %    20 - 24
 HUM:      23.2  mm     G. Age:  17w 1d         15  %
 CER:      17.8  mm     G. Age:  17w 5d         15  %
 NFT:       2.4  mm

 LV:        5.7  mm
 CM:        3.2  mm

 Est. FW:     207  gm      0 lb 7 oz     12  %
Gestational Age

 LMP:           19w 1d        Date:  05/23/20                 EDD:   02/27/21
 U/S Today:     17w 4d                                        EDD:   03/10/21
 Best:          18w 3d     Det. By:  Early Ultrasound         EDD:   03/04/21
                                     (07/12/20)
Anatomy

 Cranium:               Appears normal         Aortic Arch:            Appears normal
 Cavum:                 Appears normal         Ductal Arch:            Appears normal
 Ventricles:            Appears normal         Diaphragm:              Appears normal
 Choroid Plexus:        Appears normal         Stomach:                Appears normal, left
                                                                       sided
 Cerebellum:            Appears normal         Abdomen:                Echogenic Bowel
 Posterior Fossa:       Appears normal         Abdominal Wall:         Appears nml (cord
                                                                       insert, abd wall)
 Nuchal Fold:           Appears normal         Cord Vessels:           Appears normal (3
                                                                       vessel cord)
 Face:                  Appears normal         Kidneys:                Appear normal
                        (orbits and profile)
 Lips:                  Appears normal         Bladder:                Appears normal
 Heart:                 Appears normal         Spine:                  Appears normal
                        (4CH, axis, and
                        situs)
 RVOT:                  Appears normal         Upper Extremities:      Appears normal
 LVOT:                  Appears normal         Lower Extremities:      Appears normal
Doppler - Fetal Vessels

 Umbilical Artery
  S/D                RI               PI                     ADFV
  280                 1             1.95                       Yes

Cervix Uterus Adnexa

 Cervix
 Length:           4.05  cm.
Impression

 After discussing with Dr. Sp and based on patient's history,
 it seems she was unsure of her LMP date. It is reasonable to
 establish her EDD by early 6-week ultrasound.
 We recommend that her EDD be assigned at 03/04/20, which
 estimates the fetal weight at the 12th percentile. However,
 because of abnormal Doppler studies, fetal growth restriction
 is still possible and we recommend follow-up as
 recommended.
 xxxxxxxxxxxxxxxxxxxxxxxxxxxxxxxxxxxxxxxxxxxxxxxxxxxxxxxxx
 xxxxxxxxxxxxxxxx
 ORIGINAL Report (10/04/20)
 We performed fetal anatomical survey. Amniotic fluid is
 normal and good fetal activity is seen. Fetal spine,
 intracranial structures and abdominal wall appear normal. No
 obvious fetal structural defects are seen. Hyperechogenic
 bowel is seen. The estimated fetal weight is at the 2nd
 percentile (fetal biometry lags gestational age by 11 days).
 No evidence of periventricular or liver or abdominal
 calcifications.
 Umbilical artery Doppler study, performed because of
 suspected fetal growth restriction, showed intermittent absent-
 end-diastolic flow.

 Ms. Jumper, [place] P3 at 19w 1d gestation, is here for fetal
 anatomy scan. On serum screening, increased risk for open
 spina bifida was seen (1 in 178). MSAFP was 2.76 MoM.
 On cell-free fetal DNA screening, the risks of feral
 aneuploidies are not increased.
 Patient had ultrasound at 6 weeks and at 11 weeks'
 gestations. EDD by ultrasound was reported as 03/04/21.
 Patient seemed sure of her LMP date when she initiated her
 prenatal care and her EDD was assigned at 02/27/21 (5 days
 difference from 6-week ultrasound) based on sure LMP date.
 Patient reports no chronic medical conditions.
 Obstetric history is significant for term/preterm/term vaginal
 deliveries. All her children are in good health. Birthweights of
 term delivery infants were 2,810 g and 3,110 g.
 Patient met with our genetic counselor before and after
 ultrasound. You will be receiving a separate letter from her.
 I counseled the patient on the following:
 Early-onset fetal growth restriction
 -We discussed EDD that was assigned (appropriately) based
 on her sure LMP date. If her LMP is unsure, EDD should be
 03/04/21 (EFW will be at the 12th percentile).
 -Hyperechogenic bowel can be seen in growth-restricted
 fetuses. It is unlikely to be a marker for Down syndrome given
 that she had low risk for fetal Down syndrome on cell-free
 fetal DNA screening.
 -Cystic fibrosis screening was discussed by our genetic
 counselor and the patient would like to wait before opting for
 screening.
 -Possible causes of fetal growth restriction include fetal
 chromosomal anomalies or genetic syndromes. Other causes
 include placental insufficiency and fetal infections. Placental
 insufficiency is the most-likely cause. In women who develop
 preeclampsia later in pregnancy, growth restriction may
 manifest earlier.
 -Amniocentesis will be able to confirm chromosomal
 anomalies or some (not all) genetic syndromes. I discussed
 the procedure and its possible complications including
 miscarriage (1 in 500 procedures).
 -Explained viability (23 to 24 weeks).

 After counseling, the patient opted not to have amniocentesis.
 I recommended screening for CMV and toxo today. Patient
 opted for screening.

 I discussed follow-up ultrasound in 3 weeks for fetal growth
 and umbilical artery Doppler studies. Based on the findings,
 we will counsel the patient.
 Increased risk for open-neural tube defects and increased
 AFP:
 We reassured the patient of normal fetal anatomy on
 ultrasound including spine and intracranial structures. I
 informed her that ultrasound can detect up to [DATE]
 cases of spina bifida and that amniocentesis will only
 marginally improve the detection rate.
 Increased AFP can also be associated with fetal growth
 restriction (placental insufficiency), preterm delivery and
 stillbirth (very rare). I recommended serial fetal growth
 assessments. It can also follow vaginal bleeding. Patient had
 vaginal bleeding early in pregnancy.
Recommendations

 -CMV and Toxoplasmosis IgG and IgM (blood was drawn
 today).
 -We will communicate the results and fax copies to your
 office.
 -Follow-up fetal growth assessment and umbilical artery
 Doppler in 3 weeks.
 -We will recommend fetal echocardiography if fetal growth
 restriction persists at her next visit.
 Addendum (10/04/20): Patient also opted to have CF
 screening and the test was included in the order.
                      Behlim, Irfana

## 2021-12-11 NOTE — L&D Delivery Note (Signed)
Delivery Note  First Stage: Labor onset: w/SROM Augmentation: Pitocin Analgesia /Anesthesia intrapartum: none SROM at 2132  - rapid progression after SROM at 5cm with recurrent variables, recheck 7cm, IUPC placed and amnioinfusion started but then noted be C/C/+2 and pushed effectively with several UCs to delivery.   Second Stage: Complete dilation at 2215 Onset of pushing at 2215 FHR second stage Cat II tracing, recurrent variables  Delivery of a viable female infant on 09/23/22 at 2221 by CNM delivery of fetal head in ROA position with restitution to ROT. No nuchal cord, but large ;  Anterior then posterior shoulders delivered easily with gentle downward traction. Baby placed on mom's chest, and attended to by peds.  Cord double clamped after cessation of pulsation, cut by FOB Cord blood sample collected    Third Stage: Placenta delivered spontaneously intact with 3 VC @ 2229 Placenta disposition: routine disposal - IV access lost, IM Pitocin 10 units given.  Uterine tone firm / bleeding scant  No vaginal , perineal or cervical  laceration identified  Anesthesia for repair: n/a Repair n/a Est. Blood Loss (mL): 50  Complications: precipitous labor  Mom to postpartum.  Baby to Couplet care / Skin to Skin.  Newborn: Birth Weight: pending  Apgar Scores: 9/9 Feeding planned: breast

## 2022-04-07 ENCOUNTER — Other Ambulatory Visit: Payer: Self-pay | Admitting: Certified Nurse Midwife

## 2022-04-07 DIAGNOSIS — Z8759 Personal history of other complications of pregnancy, childbirth and the puerperium: Secondary | ICD-10-CM

## 2022-04-07 DIAGNOSIS — O0992 Supervision of high risk pregnancy, unspecified, second trimester: Secondary | ICD-10-CM

## 2022-05-09 ENCOUNTER — Ambulatory Visit: Payer: 59

## 2022-05-16 ENCOUNTER — Institutional Professional Consult (permissible substitution): Payer: 59

## 2022-05-16 ENCOUNTER — Other Ambulatory Visit: Payer: 59

## 2022-05-23 ENCOUNTER — Institutional Professional Consult (permissible substitution): Payer: 59

## 2022-05-23 ENCOUNTER — Other Ambulatory Visit: Payer: 59

## 2022-05-25 ENCOUNTER — Other Ambulatory Visit: Payer: Self-pay

## 2022-05-25 ENCOUNTER — Ambulatory Visit: Payer: 59 | Attending: Obstetrics

## 2022-05-25 ENCOUNTER — Ambulatory Visit (HOSPITAL_BASED_OUTPATIENT_CLINIC_OR_DEPARTMENT_OTHER): Payer: 59 | Admitting: Obstetrics

## 2022-05-25 DIAGNOSIS — O09292 Supervision of pregnancy with other poor reproductive or obstetric history, second trimester: Secondary | ICD-10-CM | POA: Diagnosis not present

## 2022-05-25 DIAGNOSIS — Z3A21 21 weeks gestation of pregnancy: Secondary | ICD-10-CM

## 2022-05-25 DIAGNOSIS — O0992 Supervision of high risk pregnancy, unspecified, second trimester: Secondary | ICD-10-CM

## 2022-05-25 DIAGNOSIS — O09212 Supervision of pregnancy with history of pre-term labor, second trimester: Secondary | ICD-10-CM

## 2022-05-25 DIAGNOSIS — Z8759 Personal history of other complications of pregnancy, childbirth and the puerperium: Secondary | ICD-10-CM

## 2022-05-25 NOTE — Progress Notes (Signed)
MFM Note  Samantha Levy was seen for a detailed fetal anatomy scan due to a prior fetal demise at 23+ weeks during her last pregnancy in November 2021. In that pregnancy, she was followed due to early onset IUGR with abnormal umbilical artery Doppler studies.  At 23+ weeks, oligohydramnios along with a fetal demise was found.  She also has a history of 2 prior preterm births at 33 weeks and 3 days and 36 weeks and 2 days.  She denies having any prior surgeries on her cervix.  She had a cell free DNA test earlier in her pregnancy which indicated a low risk for trisomy 25, 3, and 13. A female fetus is predicted. An MSAFP was 0.77 MoM.  She was informed that the fetal growth and amniotic fluid level were appropriate for her gestational age.   There were no obvious fetal anomalies noted on today's ultrasound exam.  The patient was informed that anomalies may be missed due to technical limitations. If the fetus is in a suboptimal position or maternal habitus is increased, visualization of the fetus in the maternal uterus may be impaired.  The following were discussed during today's consultation:  Prior IUFD at 23+ weeks  The patient was advised that the cause of her prior IUFD was most likely the result of abnormal placental function which caused IUGR, the abnormal umbilical artery Doppler studies, and oligohydramnios.  She was reassured by today's ultrasound findings of normal fetal growth and normal amniotic fluid levels.  Fetal movements were noted throughout today's exam.    Doppler studies of the umbilical arteries performed today showed a normal S/D ratio without any signs of absent or reversed end-diastolic flow.  As placental dysfunction is not suspected in this pregnancy, she was reassured that the risk of recurrence of another IUFD is low.  Due to her history of fetal growth restriction and IUFD, we will continue to follow her with monthly growth ultrasounds.    Weekly fetal  testing should be started at around 32 weeks.  She was advised to continue to monitor fetal movements throughout her pregnancy.  History of 2 prior preterm births  The patient reports that she was treated with Makena in one of her prior pregnancies.  She was advised that unfortunately Cruzita Lederer has been taken off the market by the FDA.  A transabdominal cervical length measured today was 4.8 cm long without any signs of funneling.  She was reassured that her risk of preterm birth is low at this time based on the normal cervical length noted today.    We will continue to assess her cervical lengths during her future ultrasound exams.  The patient stated that she was relieved by today's ultrasound findings and that all of her questions were answered.    A follow-up growth scan was scheduled in 4 weeks.  A total of 30 minutes was spent counseling and coordinating the care for this patient.  Greater than 50% of the time was spent in direct face-to-face contact.

## 2022-06-20 ENCOUNTER — Other Ambulatory Visit: Payer: Self-pay

## 2022-06-20 DIAGNOSIS — Z720 Tobacco use: Secondary | ICD-10-CM

## 2022-06-20 DIAGNOSIS — Z8759 Personal history of other complications of pregnancy, childbirth and the puerperium: Secondary | ICD-10-CM

## 2022-06-22 ENCOUNTER — Ambulatory Visit: Payer: 59 | Attending: Maternal & Fetal Medicine

## 2022-06-22 ENCOUNTER — Other Ambulatory Visit: Payer: Self-pay

## 2022-06-22 DIAGNOSIS — O09292 Supervision of pregnancy with other poor reproductive or obstetric history, second trimester: Secondary | ICD-10-CM

## 2022-06-22 DIAGNOSIS — O09212 Supervision of pregnancy with history of pre-term labor, second trimester: Secondary | ICD-10-CM

## 2022-06-22 DIAGNOSIS — Z3A25 25 weeks gestation of pregnancy: Secondary | ICD-10-CM

## 2022-06-22 DIAGNOSIS — Z720 Tobacco use: Secondary | ICD-10-CM | POA: Insufficient documentation

## 2022-06-22 DIAGNOSIS — Z8759 Personal history of other complications of pregnancy, childbirth and the puerperium: Secondary | ICD-10-CM | POA: Insufficient documentation

## 2022-07-20 ENCOUNTER — Ambulatory Visit: Payer: 59

## 2022-07-25 ENCOUNTER — Other Ambulatory Visit: Payer: Self-pay

## 2022-07-25 DIAGNOSIS — Z8759 Personal history of other complications of pregnancy, childbirth and the puerperium: Secondary | ICD-10-CM

## 2022-07-27 ENCOUNTER — Other Ambulatory Visit: Payer: Self-pay

## 2022-07-27 ENCOUNTER — Ambulatory Visit: Payer: 59 | Attending: Maternal & Fetal Medicine

## 2022-07-27 DIAGNOSIS — O09213 Supervision of pregnancy with history of pre-term labor, third trimester: Secondary | ICD-10-CM

## 2022-07-27 DIAGNOSIS — O321XX0 Maternal care for breech presentation, not applicable or unspecified: Secondary | ICD-10-CM | POA: Diagnosis not present

## 2022-07-27 DIAGNOSIS — Z3A30 30 weeks gestation of pregnancy: Secondary | ICD-10-CM | POA: Diagnosis not present

## 2022-07-27 DIAGNOSIS — Z8759 Personal history of other complications of pregnancy, childbirth and the puerperium: Secondary | ICD-10-CM

## 2022-07-27 DIAGNOSIS — O09293 Supervision of pregnancy with other poor reproductive or obstetric history, third trimester: Secondary | ICD-10-CM | POA: Insufficient documentation

## 2022-08-22 ENCOUNTER — Other Ambulatory Visit: Payer: Self-pay

## 2022-08-22 DIAGNOSIS — Z720 Tobacco use: Secondary | ICD-10-CM

## 2022-08-22 DIAGNOSIS — O09893 Supervision of other high risk pregnancies, third trimester: Secondary | ICD-10-CM

## 2022-08-22 DIAGNOSIS — Z8759 Personal history of other complications of pregnancy, childbirth and the puerperium: Secondary | ICD-10-CM

## 2022-08-24 ENCOUNTER — Ambulatory Visit: Payer: 59 | Attending: Obstetrics

## 2022-08-24 ENCOUNTER — Other Ambulatory Visit: Payer: Self-pay

## 2022-08-24 DIAGNOSIS — Z3A24 24 weeks gestation of pregnancy: Secondary | ICD-10-CM | POA: Diagnosis not present

## 2022-08-24 DIAGNOSIS — O09293 Supervision of pregnancy with other poor reproductive or obstetric history, third trimester: Secondary | ICD-10-CM

## 2022-08-24 DIAGNOSIS — O09893 Supervision of other high risk pregnancies, third trimester: Secondary | ICD-10-CM

## 2022-08-24 DIAGNOSIS — Z3A34 34 weeks gestation of pregnancy: Secondary | ICD-10-CM | POA: Diagnosis not present

## 2022-08-24 DIAGNOSIS — O09213 Supervision of pregnancy with history of pre-term labor, third trimester: Secondary | ICD-10-CM | POA: Diagnosis not present

## 2022-08-24 DIAGNOSIS — Z362 Encounter for other antenatal screening follow-up: Secondary | ICD-10-CM | POA: Insufficient documentation

## 2022-08-24 DIAGNOSIS — Z8759 Personal history of other complications of pregnancy, childbirth and the puerperium: Secondary | ICD-10-CM

## 2022-09-15 ENCOUNTER — Other Ambulatory Visit: Payer: Self-pay | Admitting: Obstetrics and Gynecology

## 2022-09-15 DIAGNOSIS — O0993 Supervision of high risk pregnancy, unspecified, third trimester: Secondary | ICD-10-CM

## 2022-09-15 NOTE — Progress Notes (Signed)
Dating: EDD: 09/30/22  by LMP: 12/24/21 and c/w [place] at [redacted]w[redacted]d   Preg c/b: 1. History of high risk pregnancies with PTB and poor outcomes MFM referral sent 04/04/22 (they did anat scan 05/25/22) MaternitT21 collected 04/04/22  History:  G1 PPROM at [redacted]w[redacted]d G2 PTL at 33wks G3 Term delivery at [redacted]w[redacted]d with Makena G4 IUFD at [redacted]w[redacted]d FGR, with normal fetal echo, + hyperechogenic bowel, with reversed end diastolic flow on UAD. No gross anatomic abnormalities. CMV, toxo and CF screening neg Elevated AFP 1:178, MoM below 3 Has seen genetic counselor. 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 AFP screen positive. Declined amnio Weekly NST Starting at 30 weeks MFM growth [place] scheduled for 08/24/22 at 11am  2. Smoking Started at the beginning of the pregnancy - 0.5 PPD Stopped smoking end of June [x]  Smoking cessation plan or program: Bupropion rx'd on 5/17  3. Mental health- anxiety/depression  Prenatal Labs: Blood type/Rh O Pos  Antibody screen neg  Rubella Immune  Varicella Immune  RPR NR  HBsAg Neg  HIV NR  GC neg  Chlamydia neg  Genetic screening Negative materniT21 and AFP  1 hour GTT 139  3 hour GTT  [id number]  GBS  Neg    Contraception: POPs Infant feeding: breast Tdap: 07/11/2022 Flu:

## 2022-09-23 ENCOUNTER — Encounter: Payer: Self-pay | Admitting: Obstetrics and Gynecology

## 2022-09-23 ENCOUNTER — Inpatient Hospital Stay
Admission: EM | Admit: 2022-09-23 | Discharge: 2022-09-25 | DRG: 832 | Disposition: A | Discharge status: Home / Self Care | Payer: 59 | Attending: Obstetrics | Admitting: Obstetrics

## 2022-09-23 ENCOUNTER — Other Ambulatory Visit: Payer: Self-pay

## 2022-09-23 DIAGNOSIS — F419 Anxiety disorder, unspecified: Secondary | ICD-10-CM | POA: Diagnosis present

## 2022-09-23 DIAGNOSIS — O99344 Other mental disorders complicating childbirth: Secondary | ICD-10-CM | POA: Diagnosis present

## 2022-09-23 DIAGNOSIS — Z3A39 39 weeks gestation of pregnancy: Secondary | ICD-10-CM

## 2022-09-23 DIAGNOSIS — D62 Acute posthemorrhagic anemia: Secondary | ICD-10-CM | POA: Diagnosis not present

## 2022-09-23 DIAGNOSIS — O9081 Anemia of the puerperium: Secondary | ICD-10-CM | POA: Diagnosis not present

## 2022-09-23 DIAGNOSIS — O26893 Other specified pregnancy related conditions, third trimester: Secondary | ICD-10-CM | POA: Diagnosis present

## 2022-09-23 DIAGNOSIS — F32A Depression, unspecified: Secondary | ICD-10-CM | POA: Diagnosis present

## 2022-09-23 DIAGNOSIS — Z87891 Personal history of nicotine dependence: Secondary | ICD-10-CM

## 2022-09-23 DIAGNOSIS — Z3490 Encounter for supervision of normal pregnancy, unspecified, unspecified trimester: Principal | ICD-10-CM | POA: Diagnosis present

## 2022-09-23 DIAGNOSIS — O0993 Supervision of high risk pregnancy, unspecified, third trimester: Secondary | ICD-10-CM

## 2022-09-23 LAB — TYPE AND SCREEN
ABO/RH(D): O POS
Antibody Screen: NEGATIVE

## 2022-09-23 LAB — COMPREHENSIVE METABOLIC PANEL
ALT: 9 U/L (ref 0–44)
AST: 18 U/L (ref 15–41)
Albumin: 3 g/dL — ABNORMAL LOW (ref 3.5–5.0)
Alkaline Phosphatase: 144 U/L — ABNORMAL HIGH (ref 38–126)
Anion gap: 10 (ref 5–15)
BUN: 10 mg/dL (ref 6–20)
CO2: 20 mmol/L — ABNORMAL LOW (ref 22–32)
Calcium: 9.3 mg/dL (ref 8.9–10.3)
Chloride: 110 mmol/L (ref 98–111)
Creatinine, Ser: 0.72 mg/dL (ref 0.44–1.00)
GFR, Estimated: >60 mL/min (ref >60)
Glucose, Bld: 84 mg/dL (ref 70–99)
Potassium: 4 mmol/L (ref 3.5–5.1)
Sodium: 140 mmol/L (ref 135–145)
Total Bilirubin: 0.3 mg/dL (ref 0.3–1.2)
Total Protein: 6.4 g/dL — ABNORMAL LOW (ref 6.5–8.1)

## 2022-09-23 LAB — CBC
HCT: 31.5 % — ABNORMAL LOW (ref 36.0–46.0)
Hemoglobin: 10.6 g/dL — ABNORMAL LOW (ref 12.0–15.0)
MCH: 30.7 pg (ref 26.0–34.0)
MCHC: 33.7 g/dL (ref 30.0–36.0)
MCV: 91.3 fL (ref 80.0–100.0)
Platelets: 171 10*3/uL (ref 150–400)
RBC: 3.45 MIL/uL — ABNORMAL LOW (ref 3.87–5.11)
RDW: 13.4 % (ref 11.5–15.5)
WBC: 11.1 10*3/uL — ABNORMAL HIGH (ref 4.0–10.5)
nRBC: 0 % (ref 0.0–0.2)

## 2022-09-23 LAB — PROTEIN / CREATININE RATIO, URINE
Creatinine, Urine: 86 mg/dL
Protein Creatinine Ratio: 0.14 mg/mg{Cre} (ref 0.00–0.15)
Total Protein, Urine: 12 mg/dL

## 2022-09-23 MED ORDER — WITCH HAZEL-GLYCERIN EX PADS: 1.0000 | MEDICATED_PAD | CUTANEOUS | Status: DC | PRN

## 2022-09-23 MED ORDER — MISOPROSTOL 200 MCG PO TABS
ORAL_TABLET | ORAL | Status: AC
  Filled 2022-09-23: qty 4

## 2022-09-23 MED ORDER — DIPHENHYDRAMINE HCL 25 MG PO CAPS: 25.0000 mg | ORAL_CAPSULE | Freq: Four times a day (QID) | ORAL | Status: DC | PRN

## 2022-09-23 MED ORDER — OXYTOCIN 10 UNIT/ML IJ SOLN
INTRAMUSCULAR | Status: AC
  Administered 2022-09-23: 10 [IU] via INTRAMUSCULAR
  Filled 2022-09-23: qty 2

## 2022-09-23 MED ORDER — BUPROPION HCL ER (XL) 150 MG PO TB24
150.0000 mg | ORAL_TABLET | Freq: Every day | ORAL | Status: DC
  Administered 2022-09-24: 150 mg via ORAL
  Filled 2022-09-23 (×2): qty 1

## 2022-09-23 MED ORDER — MISOPROSTOL 25 MCG QUARTER TABLET: 25.0000 ug | ORAL_TABLET | Freq: Once | ORAL | Status: DC

## 2022-09-23 MED ORDER — ZOLPIDEM TARTRATE 5 MG PO TABS: 5.0000 mg | ORAL_TABLET | Freq: Every evening | ORAL | Status: DC | PRN

## 2022-09-23 MED ORDER — DIBUCAINE (PERIANAL) 1 % EX OINT: 1.0000 | TOPICAL_OINTMENT | CUTANEOUS | Status: DC | PRN

## 2022-09-23 MED ORDER — LACTATED RINGERS IV SOLN: 500.0000 mL | INTRAVENOUS | Status: DC | PRN

## 2022-09-23 MED ORDER — LACTATED RINGERS IV SOLN: INTRAVENOUS | Status: DC

## 2022-09-23 MED ORDER — BENZOCAINE-MENTHOL 20-0.5 % EX AERO: 1.0000 | INHALATION_SPRAY | CUTANEOUS | Status: DC | PRN

## 2022-09-23 MED ORDER — IBUPROFEN 600 MG PO TABS
ORAL_TABLET | ORAL | Status: AC
  Filled 2022-09-23: qty 1

## 2022-09-23 MED ORDER — IBUPROFEN 600 MG PO TABS
600.0000 mg | ORAL_TABLET | Freq: Four times a day (QID) | ORAL | Status: DC
  Administered 2022-09-23 – 2022-09-25 (×7): 600 mg via ORAL
  Filled 2022-09-23 (×6): qty 1

## 2022-09-23 MED ORDER — AMMONIA AROMATIC IN INHA
RESPIRATORY_TRACT | Status: AC
  Filled 2022-09-23: qty 10

## 2022-09-23 MED ORDER — SOD CITRATE-CITRIC ACID 500-334 MG/5ML PO SOLN: 30.0000 mL | ORAL | Status: DC | PRN

## 2022-09-23 MED ORDER — ONDANSETRON HCL 4 MG PO TABS: 4.0000 mg | ORAL_TABLET | ORAL | Status: DC | PRN

## 2022-09-23 MED ORDER — ONDANSETRON HCL 4 MG/2ML IJ SOLN: 4.0000 mg | Freq: Four times a day (QID) | INTRAMUSCULAR | Status: DC | PRN

## 2022-09-23 MED ORDER — LIDOCAINE HCL (PF) 1 % IJ SOLN
INTRAMUSCULAR | Status: AC
  Filled 2022-09-23: qty 30

## 2022-09-23 MED ORDER — TERBUTALINE SULFATE 1 MG/ML IJ SOLN: 0.2500 mg | Freq: Once | INTRAMUSCULAR | Status: DC | PRN

## 2022-09-23 MED ORDER — OXYTOCIN-SODIUM CHLORIDE 30-0.9 UT/500ML-% IV SOLN: 2.5000 [IU]/h | INTRAVENOUS | Status: DC

## 2022-09-23 MED ORDER — ACETAMINOPHEN 325 MG PO TABS
ORAL_TABLET | ORAL | Status: AC
  Filled 2022-09-23: qty 2

## 2022-09-23 MED ORDER — ACETAMINOPHEN 325 MG PO TABS
650.0000 mg | ORAL_TABLET | ORAL | Status: DC | PRN
  Administered 2022-09-23 – 2022-09-24 (×2): 650 mg via ORAL
  Filled 2022-09-23: qty 2

## 2022-09-23 MED ORDER — COCONUT OIL OIL: 1.0000 | TOPICAL_OIL | Status: DC | PRN

## 2022-09-23 MED ORDER — ACETAMINOPHEN 325 MG PO TABS: 650.0000 mg | ORAL_TABLET | ORAL | Status: DC | PRN

## 2022-09-23 MED ORDER — ONDANSETRON HCL 4 MG/2ML IJ SOLN: 4.0000 mg | INTRAMUSCULAR | Status: DC | PRN

## 2022-09-23 MED ORDER — FENTANYL CITRATE (PF) 100 MCG/2ML IJ SOLN: 50.0000 ug | INTRAMUSCULAR | Status: DC | PRN

## 2022-09-23 MED ORDER — OXYTOCIN BOLUS FROM INFUSION: 333.0000 mL | Freq: Once | INTRAVENOUS | Status: DC

## 2022-09-23 MED ORDER — OXYTOCIN-SODIUM CHLORIDE 30-0.9 UT/500ML-% IV SOLN
1.0000 m[IU]/min | INTRAVENOUS | Status: DC
  Administered 2022-09-23: 2 m[IU]/min via INTRAVENOUS
  Filled 2022-09-23: qty 500

## 2022-09-23 MED ORDER — LIDOCAINE HCL (PF) 1 % IJ SOLN: 30.0000 mL | INTRAMUSCULAR | Status: DC | PRN

## 2022-09-23 NOTE — Discharge Summary (Signed)
Obstetrical Discharge Summary  Patient Name: Samantha Levy DOB: 07/24/1991 MRN: [id number]  Date of Admission: 09/23/2022 Date of Delivery: 09/23/22 Delivered by: Dala Dock CNM  Date of Discharge: 09/25/22  Primary OB: [facility] JSH:[id number] last menstrual period was 12/24/2021. EDC Estimated Date of Delivery: 09/30/22 Gestational Age at Delivery: [redacted]w[redacted]d   Antepartum complications:  History of high risk pregnancies with PTB and poor outcomes  Smoking cigarettes, quit June 2023 Mental health, anxiety and depression.   Admitting Diagnosis: Encounter for elective induction of labor [Z34.90]  Secondary Diagnosis: Patient Active Problem List   Diagnosis Date Noted   Encounter for elective induction of labor 09/23/2022   Fetal demise, greater than 22 weeks, antepartum 11/09/2020   Labor and delivery indication for care or intervention 09/17/2019   Pregnancy 05/06/2019   Preterm labor with preterm delivery 02/14/2018   Tobacco abuse 08/20/2017    Discharge Diagnosis: Term Pregnancy Delivered      Augmentation: Pitocin Complications: None Intrapartum complications/course: admitted for IOL, low dose Pitocin then SROM. Recurrent variables and precipitous labor/birth after SROM.  Delivery Type: spontaneous vaginal delivery Anesthesia: non-pharmacological methods Placenta: spontaneous To Pathology: No  Laceration: none Episiotomy: none Newborn Data: Live born female   Birth Weight:  6#4 APGAR: 13, 9  Newborn Delivery   Birth date/time: 09/23/2022 22:21:00 Delivery type: Vaginal, Spontaneous      Postpartum Procedures: none  Edinburgh:     09/24/2022    4:11 PM 11/10/2020    9:46 AM 09/18/2019    9:15 AM  Edinburgh Postnatal Depression Scale Screening Tool  I have been able to laugh and see the funny side of things. 0 1 0  I have looked forward with enjoyment to things. 0 1 0  I have blamed myself unnecessarily when things went wrong. 1 1 2   I have been  anxious or worried for no good reason. 1 1 2   I have felt scared or panicky for no good reason. 1 1 2   Things have been getting on top of me. 1 1 1   I have been so unhappy that I have had difficulty sleeping. 0 1 0  I have felt sad or miserable. 1 1 0  I have been so unhappy that I have been crying. 1 1 1   The thought of harming myself has occurred to me. 0 0 0  Edinburgh Postnatal Depression Scale Total 6 9 8      Post partum course:  Patient had an uncomplicated postpartum course.  By time of discharge on PPD#2, her pain was controlled on oral pain medications; she had appropriate lochia and was ambulating, voiding without difficulty and tolerating regular diet.  She was deemed stable for discharge to home.      Discharge Physical Exam:  BP 100/68 (BP Location: Right Arm)   Pulse 62   Temp 98.2 F (36.8 C)   Resp 20   Ht 5\' 1"  (1.549 m)   Wt 69.9 kg   LMP 12/24/2021   SpO2 99%   Breastfeeding Unknown   BMI 29.10 kg/m   General: NAD CV: RRR Pulm: CTABL, nl effort ABD: s/nd/nt, fundus firm and below the umbilicus Lochia: moderate Perineum:minimal edema/intact DVT Evaluation: LE non-ttp, no evidence of DVT on exam.  Hemoglobin  Date Value Ref Range Status  09/24/2022 10.2 (L) 12.0 - 15.0 g/dL Final   11.1 - 15.9 g/dL Final   HCT  Date Value Ref Range Status  09/24/2022 31.0 (L) 36.0 - 46.0 % Final  Hematocrit  Date Value Ref Range Status  01/08/2018 36.4 34.0 - 46.6 % Final    Risk assessment for postpartum VTE and prophylactic treatment: Very high risk factors: None High risk factors: None Moderate risk factors: None  Postpartum VTE prophylaxis with LMWH not indicated  Disposition: stable, discharge to home. Baby Feeding: breast feeding Baby Disposition: home with mom  Rh Immune globulin indicated: No Rubella vaccine given: was not indicated Varivax vaccine given: was not indicated Flu vaccine given in AP setting: declined  prenatally Tdap vaccine given in AP setting: Yes 07/11/22  Contraception: oral progesterone-only contraceptive  Prenatal Labs:  Blood type/Rh O Pos  Antibody screen neg  Rubella Immune  Varicella Immune  RPR NR  HBsAg Neg  HIV NR  GC neg  Chlamydia neg  Genetic screening Negative materniT21 and AFP  1 hour GTT 139  3 hour GTT  75-137-124-119  GBS  Neg       Plan:  Samantha Levy was discharged to home in good condition. Follow-up appointment with delivering provider in 2 and 6 weeks.  Discharge Medications: Allergies as of 09/25/2022   No Known Allergies      Medication List     TAKE these medications    acetaminophen 325 MG tablet Commonly known as: Tylenol Take 2 tablets (650 mg total) by mouth every 4 (four) hours as needed (for pain scale < 4). What changed:  medication strength how much to take when to take this reasons to take this   benzocaine-Menthol 20-0.5 % Aero Commonly known as: DERMOPLAST Apply 1 Application topically as needed for irritation (perineal discomfort).   buPROPion 150 MG 24 hr tablet Commonly known as: WELLBUTRIN XL Take 150 mg by mouth daily. Pt only taking 1 a day as of 08/24/22   coconut oil Oil Apply 1 Application topically as needed.   dibucaine 1 % Oint Commonly known as: NUPERCAINAL Place 1 Application rectally as needed for hemorrhoids.   ferrous sulfate 325 (65 FE) MG tablet Take 1 tablet (325 mg total) by mouth 2 (two) times daily with a meal.   ibuprofen 600 MG tablet Commonly known as: ADVIL Take 1 tablet (600 mg total) by mouth every 6 (six) hours.   multivitamin-prenatal 27-0.8 MG Tabs tablet Take 1 tablet by mouth daily at 12 noon.   senna-docusate 8.6-50 MG tablet Commonly known as: Senokot-S Take 2 tablets by mouth daily. Start taking on: September 26, 2022   simethicone 80 MG chewable tablet Commonly known as: MYLICON Chew 1 tablet (80 mg total) by mouth as needed for flatulence.   witch  hazel-glycerin pad Commonly known as: TUCKS Apply 1 Application topically as needed for hemorrhoids.         Follow-up [facility] Follow up in 2 week(s).   Why: PP mood check Contact information: [facility], CNM Follow up in 6 week(s).   Specialty: Obstetrics and Gynecology Why: routine PP checkup Contact information: [place] [address] [phone]                 [place], CNM 09/25/2022 12:31 PM

## 2022-09-23 NOTE — Progress Notes (Signed)
Labor Progress Note  Samantha Levy is a 31 y.o. [id number] at [redacted]w[redacted]d by LMP admitted for induction of labor due to term with prior poor outcomes.   Subjective: FHR decels reported by Nursing, CNM to room. Pt feeling mild cramping only.   Objective: BP 126/71 (BP Location: Right Arm)   Pulse 87   Temp 98.2 F (36.8 C) (Oral)   Resp 18   Ht 5\' 1"  (1.549 m)   Wt 69.9 kg   LMP 12/24/2021   BMI 29.10 kg/m  Notable VS details: reviewed  Fetal Assessment: FHT:  FHR: 130 bpm, variability: moderate,  accelerations:  Present,  decelerations:  Present late decels then prolonged  decels x 2.  - maternal position changed and Pitocin DC  Category/reactivity:  Category II UC:   irreg UCs and UI noted.  SVE:   deferred. Last exam 3/50/-2, soft/posterior.   Membrane status: intact Amniotic color: n/a  Labs: Lab Results  Component Value Date   WBC 11.1 (H) 09/23/2022   HGB 10.6 (L) 09/23/2022   HCT 31.5 (L) 09/23/2022   MCV 91.3 09/23/2022   PLT 171 09/23/2022    Assessment / Plan: [id number] at [redacted]w[redacted]d  Term IOL with Cat II tracing  Labor: Pitocin  off during decels, will restart now.  Preeclampsia:  no e/o Pre-E Fetal Wellbeing:  Category II Pain Control:  Labor support without medications I/D:  n/a Anticipated MOD:  NSVD  Francetta Found, CNM 09/23/2022, 5:58 PM

## 2022-09-23 NOTE — H&P (Signed)
OB History & Physical   History of Present Illness:  Chief Complaint: scheduled IOL  HPI:  Samantha Levy is a 31 y.o. [id number] female at [redacted]w[redacted]d dated by LMP 12/24/21 and c/w [place] at [redacted]w[redacted]d; EDD 09/30/22.  She presents to L&D for scheduled IOL due to recurrent poor pregnancy outcomes and recommendation for 39wk delivery by MFM.   Active FM; denies LOF, VB or UCs   Pregnancy Issues: 1. History of high risk pregnancies with PTB and poor outcomes MFM referral sent 04/04/22 (they did anat scan 05/25/22) MaternitT21 collected 04/04/22  History:  G1 PPROM at [redacted]w[redacted]d G2 PTL at 33wks G3 Term delivery at [redacted]w[redacted]d with Makena G4 IUFD at [redacted]w[redacted]d FGR, with normal fetal echo, + hyperechogenic bowel, with reversed end diastolic flow on UAD. No gross anatomic abnormalities. CMV, toxo and CF screening neg Elevated AFP 1:178, MoM below 3 Has seen genetic counselor. 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 AFP screen positive. Declined amnio Weekly NST Starting at 30 weeks MFM growth [place] scheduled for 08/24/22 at 11am  2. Smoking Started at the beginning of the pregnancy - 0.5 PPD Stopped smoking end of June [x]  Smoking cessation plan or program: Bupropion rx'd on 5/17  3. Mental health- anxiety/depression   Maternal Medical History:   Past Medical History:  Diagnosis Date   Medical history non-contributory     Past Surgical History:  Procedure Laterality Date   NO PAST SURGERIES      No Known Allergies  Prior to Admission medications   Medication Sig Start Date End Date Taking? Authorizing Provider  buPROPion (WELLBUTRIN XL) 150 MG 24 hr tablet Take 150 mg by mouth daily. Pt only taking 1 a day as of 08/24/22   Yes [provider]  Prenatal Vit-Fe Fumarate-FA (MULTIVITAMIN-PRENATAL) 27-0.8 MG TABS tablet Take 1 tablet by mouth daily at 12 noon.   Yes [provider]  acetaminophen (TYLENOL) 500 MG tablet Take 2 tablets (1,000 mg total) by mouth every 6 (six) hours as needed for mild pain or  moderate pain. 09/18/19   11/18/19, CNM     [facility]renatal care [facility]  Social History: She  reports that she quit smoking about 3 years ago. Her smoking use included cigarettes. She smoked an average of .5 packs per day. She has never used smokeless tobacco. She reports that she does not currently use alcohol. She reports that she does not currently use drugs.  Family History: family history includes Diabetes type II in her mother.   Review of Systems: A full review of systems was performed and negative except as noted in the HPI.     Physical Exam:  Vital Signs: BP 126/71 (BP Location: Right Arm)   Pulse 87   Temp 98.2 F (36.8 C) (Oral)   Resp 18   Ht 5\' 1"  (1.549 m)   Wt 69.9 kg   LMP 12/24/2021   BMI 29.10 kg/m   General: no acute distress.  HEENT: normocephalic, atraumatic Heart: regular rate & rhythm.  No murmurs/rubs/gallops Lungs: clear to auscultation bilaterally, normal respiratory effort Abdomen: soft, gravid, non-tender;  EFW: 7lbs Pelvic:   External: Normal external female genitalia  Cervix: Dilation: 3 / Effacement (%): 50 / Station: -2 Soft/posterior   Extremities: non-tender, symmetric, no edema bilaterally.  DTRs: 2+  Neurologic: Alert & oriented x 3.    Results for orders placed or performed during the hospital encounter of 09/23/22 (from the past 24 hour(s))  CBC     Status: Abnormal  Collection Time: 09/23/22  4:03 PM  Result Value Ref Range   WBC 11.1 (H) 4.0 - 10.5 K/uL   RBC 3.45 (L) 3.87 - 5.11 MIL/uL   Hemoglobin 10.6 (L) 12.0 - 15.0 g/dL   HCT 31.5 (L) 36.0 - 46.0 %   MCV 91.3 80.0 - 100.0 fL   MCH 30.7 26.0 - 34.0 pg   MCHC 33.7 30.0 - 36.0 g/dL   RDW 13.4 11.5 - 15.5 %   Platelets 171 150 - 400 K/uL   nRBC 0.0 0.0 - 0.2 %  Type and screen     Status: None   Collection Time: 09/23/22  4:03 PM  Result Value Ref Range   ABO/RH(D) O POS    Antibody Screen NEG    Sample Expiration      09/26/2022,2359 Performed  at [facility], [address], [place], [place] [address]     Pertinent Results:  Prenatal Labs: Blood type/Rh O Pos  Antibody screen neg  Rubella Immune  Varicella Immune  RPR NR  HBsAg Neg  HIV NR  GC neg  Chlamydia neg  Genetic screening Negative materniT21 and AFP  1 hour GTT 139  3 hour GTT  75-137-124-119  GBS  Neg      FHT: 130bpm, mod var, + accels, no decels TOCO: Irreg occasional UC SVE:  Dilation: 3 / Effacement (%): 50 / Station: -2    Cephalic by leopolds/SVE  No results found.  Assessment:  Samantha Levy is a 31 y.o. [phone] female at [redacted]w[redacted]d with scheduled term IOL due to multiple poor OB outcomes. .   Plan:  1. Admit to Labor & Delivery; consents reviewed and obtained - admitted for IOL due to MFM recommendations due to prior poor pregnancy outcomes.  - Dr Glennon Mac notified of admission.   2. Fetal Well being  - Fetal Tracing: cat I  - Group B Streptococcus ppx indicated: Neg - Presentation: cephalic confirmed by exam   3. Routine OB: - Prenatal labs reviewed, as above - Rh O Pos - CBC, T&S, RPR on admit - Clear fluids, IVF  4. Induction of Labor -  Contractions: external toco in place -  Pelvis proven to 3110gm -  Plan for induction with Pitocin, then AROM -  Plan for continuous fetal monitoring  -  Maternal pain control as desired; undecided - Anticipate vaginal delivery  5. Post Partum Planning: Contraception: POPs Infant feeding: breast Tdap: 07/11/2022 Flu: declined prenatally  Francetta Found, CNM 09/23/22 5:06 PM

## 2022-09-24 ENCOUNTER — Encounter: Payer: Self-pay | Admitting: Obstetrics and Gynecology

## 2022-09-24 LAB — CBC
HCT: 31 % — ABNORMAL LOW (ref 36.0–46.0)
Hemoglobin: 10.2 g/dL — ABNORMAL LOW (ref 12.0–15.0)
MCH: 30.3 pg (ref 26.0–34.0)
MCHC: 32.9 g/dL (ref 30.0–36.0)
MCV: 92 fL (ref 80.0–100.0)
Platelets: 154 10*3/uL (ref 150–400)
RBC: 3.37 MIL/uL — ABNORMAL LOW (ref 3.87–5.11)
RDW: 13.6 % (ref 11.5–15.5)
WBC: 13 10*3/uL — ABNORMAL HIGH (ref 4.0–10.5)
nRBC: 0 % (ref 0.0–0.2)

## 2022-09-24 LAB — RPR: RPR Ser Ql: NONREACTIVE

## 2022-09-24 MED ORDER — SENNOSIDES-DOCUSATE SODIUM 8.6-50 MG PO TABS
2.0000 | ORAL_TABLET | Freq: Every day | ORAL | Status: DC
  Administered 2022-09-24 – 2022-09-25 (×2): 2 via ORAL
  Filled 2022-09-24 (×2): qty 2

## 2022-09-24 MED ORDER — SIMETHICONE 80 MG PO CHEW: 80.0000 mg | CHEWABLE_TABLET | ORAL | Status: DC | PRN

## 2022-09-24 MED ORDER — FERROUS SULFATE 325 (65 FE) MG PO TABS
325.0000 mg | ORAL_TABLET | Freq: Two times a day (BID) | ORAL | Status: DC
  Administered 2022-09-24 – 2022-09-25 (×3): 325 mg via ORAL
  Filled 2022-09-24 (×3): qty 1

## 2022-09-24 MED ORDER — PRENATAL MULTIVITAMIN CH
1.0000 | ORAL_TABLET | Freq: Every day | ORAL | Status: DC
  Administered 2022-09-24 – 2022-09-25 (×2): 1 via ORAL
  Filled 2022-09-24 (×2): qty 1

## 2022-09-24 NOTE — Lactation Note (Signed)
This note was copied from a baby's chart. Lactation Consultation Note  Patient Name: Samantha Levy [id number] Date: 09/24/2022 Reason for consult: Initial assessment;Term Age:31 hours  Maternal Data This is mom's 4th baby, SVD. Mom is an experienced breastfeeding mother. Met with mom this afternoon. Mom reports baby is latching and breastfeeding well. Mom reports no challenges with breastfeeding previous 3 children. Mom reports she had only 1 episode of slight breast engorgement. Does the patient have breastfeeding experience prior to this delivery?: Yes How long did the patient breastfeed?: Mom breastfed her previous 3 children for > than 1 year each.  Feeding Mother's Current Feeding Choice: Breast Milk   Interventions Interventions: Breast feeding basics reviewed;Education Mom has [facility] number on white board if she has any lactation questions or needs.  Discharge Discharge Education: Engorgement and breast care;Other (comment) (Mom will bring baby for follow-up at [facility] clinic .She understands if she has breastfeeding questions or would like to be seen after discharge to home she can call [facility] lactation.) Pump: Personal  Consult Status Consult Status: PRN  Update provided to care nurse.  Samantha Levy 09/24/2022, 3:48 PM

## 2022-09-24 NOTE — Progress Notes (Signed)
Post Partum Day 1 Subjective: Doing well, no complaints.  Tolerating regular diet, pain with PO meds, voiding and ambulating without difficulty.  No CP SOB Fever,Chills, N/V or leg pain; denies nipple or breast pain no HA change of vision, RUQ/epigastric pain  Objective: BP (!) 96/58 (BP Location: Right Arm) Comment: nurse Kierra notified  Pulse 66   Temp 98.2 F (36.8 C) (Oral)   Resp 18   Ht 5\' 1"  (1.549 m)   Wt 69.9 kg   LMP 12/24/2021   SpO2 97% Comment: Room Air  Breastfeeding Unknown   BMI 29.10 kg/m    Physical Exam:  General: NAD Breasts: soft/nontender CV: RRR Pulm: nl effort, CTABL Abdomen: soft, NT, BS x 4 Perineum: minimal edema, intact Lochia: small Uterine Fundus: fundus firm and 1 fb below umbilicus DVT Evaluation: no cords, ttp LEs   Recent Labs    09/23/22 1603 09/24/22 0639  HGB 10.6* 10.2*  HCT 31.5* 31.0*  WBC 11.1* 13.0*  PLT 171 154    Assessment/Plan: 32 y.o. [id number] postpartum day # 1  - Continue routine PP care - Lactation consult - previously considering POPs, but interested in more info on Nexplanon and LARC methods.  - Acute blood loss anemia - hemodynamically stable and asymptomatic; start po ferrous sulfate BID with stool softeners  - Immunization status: all Imms up to date    Disposition: Does not desire Dc home today.     Francetta Found, CNM 09/24/2022  9:46 AM

## 2022-09-25 ENCOUNTER — Encounter: Payer: Self-pay | Admitting: Obstetrics and Gynecology

## 2022-09-25 MED ORDER — SENNOSIDES-DOCUSATE SODIUM 8.6-50 MG PO TABS: 2.0000 | ORAL_TABLET | Freq: Every day | ORAL | Status: AC

## 2022-09-25 MED ORDER — COCONUT OIL OIL: 1.0000 | TOPICAL_OIL | 0 refills | Status: AC | PRN

## 2022-09-25 MED ORDER — IBUPROFEN 600 MG PO TABS: 600.0000 mg | ORAL_TABLET | Freq: Four times a day (QID) | ORAL | 0 refills | Status: AC

## 2022-09-25 MED ORDER — WITCH HAZEL-GLYCERIN EX PADS: 1.0000 | MEDICATED_PAD | CUTANEOUS | 12 refills | Status: AC | PRN

## 2022-09-25 MED ORDER — SIMETHICONE 80 MG PO CHEW: 80.0000 mg | CHEWABLE_TABLET | ORAL | 0 refills | Status: AC | PRN

## 2022-09-25 MED ORDER — DIBUCAINE (PERIANAL) 1 % EX OINT: 1.0000 | TOPICAL_OINTMENT | CUTANEOUS | Status: AC | PRN

## 2022-09-25 MED ORDER — BENZOCAINE-MENTHOL 20-0.5 % EX AERO: 1.0000 | INHALATION_SPRAY | CUTANEOUS | Status: AC | PRN

## 2022-09-25 MED ORDER — ACETAMINOPHEN 325 MG PO TABS: 650.0000 mg | ORAL_TABLET | ORAL | Status: AC | PRN

## 2022-09-25 MED ORDER — FERROUS SULFATE 325 (65 FE) MG PO TABS: 325.0000 mg | ORAL_TABLET | Freq: Two times a day (BID) | ORAL | 3 refills | Status: AC

## 2022-09-25 NOTE — Discharge Instructions (Signed)

## 2022-09-25 NOTE — Progress Notes (Signed)
Patient discharged home with family.  Discharge instructions, when to follow up, and prescriptions reviewed with patient.  Patient verbalized understanding. Patient will be escorted out by auxiliary.   

## 2023-05-31 ENCOUNTER — Other Ambulatory Visit: Payer: Self-pay | Admitting: Certified Nurse Midwife

## 2023-05-31 DIAGNOSIS — O3680X0 Pregnancy with inconclusive fetal viability, not applicable or unspecified: Secondary | ICD-10-CM

## 2023-06-05 ENCOUNTER — Ambulatory Visit
Admission: RE | Admit: 2023-06-05 | Discharge: 2023-06-05 | Disposition: A | Discharge status: Home / Self Care | Payer: 59 | Source: Ambulatory Visit | Attending: Certified Nurse Midwife | Admitting: Certified Nurse Midwife

## 2023-06-05 DIAGNOSIS — O3680X0 Pregnancy with inconclusive fetal viability, not applicable or unspecified: Secondary | ICD-10-CM | POA: Insufficient documentation

## 2024-07-25 ENCOUNTER — Encounter: Payer: Self-pay | Admitting: *Deleted

## 2024-08-01 ENCOUNTER — Ambulatory Visit: Admission: RE | Admit: 2024-08-01 | Source: Home / Self Care

## 2024-08-01 HISTORY — DX: Depression, unspecified: F32.A

## 2024-08-01 HISTORY — DX: Anxiety disorder, unspecified: F41.9

## 2024-08-01 SURGERY — COLONOSCOPY
Anesthesia: General

## 2024-10-13 ENCOUNTER — Other Ambulatory Visit: Payer: Self-pay | Admitting: Gastroenterology

## 2024-10-13 DIAGNOSIS — K625 Hemorrhage of anus and rectum: Secondary | ICD-10-CM

## 2024-10-16 ENCOUNTER — Ambulatory Visit
Admission: RE | Admit: 2024-10-16 | Discharge: 2024-10-16 | Disposition: A | Discharge status: Home / Self Care | Source: Ambulatory Visit | Attending: Gastroenterology | Admitting: Gastroenterology

## 2024-10-16 DIAGNOSIS — K625 Hemorrhage of anus and rectum: Secondary | ICD-10-CM | POA: Insufficient documentation

## 2025-02-25 ENCOUNTER — Inpatient Hospital Stay: Admit: 2025-02-25 | Admitting: Obstetrics and Gynecology
